# Patient Record
Sex: Male | Born: 1970 | Race: White | Hispanic: No | Marital: Married | Smoking: Former smoker
Health system: Southern US, Community
[De-identification: ages and names within clinical notes are randomized; demographics above are authoritative.]

## PROBLEM LIST (undated history)

## (undated) ENCOUNTER — Ambulatory Visit: Admission: EM | Discharge status: Absent without leave | Payer: Medicare Other

## (undated) DIAGNOSIS — Z113 Encounter for screening for infections with a predominantly sexual mode of transmission: Secondary | ICD-10-CM

## (undated) DIAGNOSIS — I639 Cerebral infarction, unspecified: Secondary | ICD-10-CM

## (undated) DIAGNOSIS — Z79899 Other long term (current) drug therapy: Secondary | ICD-10-CM

## (undated) DIAGNOSIS — T7840XA Allergy, unspecified, initial encounter: Secondary | ICD-10-CM

## (undated) DIAGNOSIS — I10 Essential (primary) hypertension: Secondary | ICD-10-CM

## (undated) DIAGNOSIS — H547 Unspecified visual loss: Secondary | ICD-10-CM

## (undated) DIAGNOSIS — K429 Umbilical hernia without obstruction or gangrene: Secondary | ICD-10-CM

## (undated) DIAGNOSIS — Z789 Other specified health status: Secondary | ICD-10-CM

## (undated) DIAGNOSIS — F419 Anxiety disorder, unspecified: Secondary | ICD-10-CM

## (undated) HISTORY — DX: Essential (primary) hypertension: I10

## (undated) HISTORY — DX: Allergy, unspecified, initial encounter: T78.40XA

## (undated) HISTORY — DX: Other specified health status: Z78.9

## (undated) HISTORY — DX: Unspecified visual loss: H54.7

## (undated) HISTORY — DX: Anxiety disorder, unspecified: F41.9

## (undated) HISTORY — DX: Other long term (current) drug therapy: Z79.899

## (undated) HISTORY — DX: Umbilical hernia without obstruction or gangrene: K42.9

## (undated) HISTORY — DX: Encounter for screening for infections with a predominantly sexual mode of transmission: Z11.3

---

## 2008-04-23 LAB — CONVERTED CEMR LAB
CD4 Count: 152 microliters
CD4 T Helper %: 16 %
HIV 1 RNA Quant: <48 copies/mL

## 2008-08-15 LAB — CONVERTED CEMR LAB
CD4 Count: 256 microliters
CD4 T Helper %: 18 %

## 2009-01-31 ENCOUNTER — Ambulatory Visit: Payer: Self-pay | Admitting: Infectious Diseases

## 2009-01-31 DIAGNOSIS — D696 Thrombocytopenia, unspecified: Secondary | ICD-10-CM | POA: Insufficient documentation

## 2009-01-31 DIAGNOSIS — B59 Pneumocystosis: Secondary | ICD-10-CM

## 2009-01-31 DIAGNOSIS — F1910 Other psychoactive substance abuse, uncomplicated: Secondary | ICD-10-CM

## 2009-01-31 DIAGNOSIS — M545 Low back pain: Secondary | ICD-10-CM

## 2009-01-31 DIAGNOSIS — F341 Dysthymic disorder: Secondary | ICD-10-CM | POA: Insufficient documentation

## 2009-01-31 DIAGNOSIS — B370 Candidal stomatitis: Secondary | ICD-10-CM | POA: Insufficient documentation

## 2009-01-31 DIAGNOSIS — M129 Arthropathy, unspecified: Secondary | ICD-10-CM | POA: Insufficient documentation

## 2009-01-31 DIAGNOSIS — D409 Neoplasm of uncertain behavior of male genital organ, unspecified: Secondary | ICD-10-CM | POA: Insufficient documentation

## 2009-01-31 LAB — CONVERTED CEMR LAB
ALT: 18 units/L (ref 0–53)
Alkaline Phosphatase: 111 units/L (ref 39–117)
Basophils Absolute: 0 10*3/uL (ref 0.0–0.1)
Basophils Relative: 1 % (ref 0–1)
CO2: 24 meq/L (ref 19–32)
Chlamydia, Swab/Urine, PCR: NEGATIVE
Cholesterol: 161 mg/dL (ref 0–200)
Creatinine, Ser: 0.92 mg/dL (ref 0.40–1.50)
Eosinophils Absolute: 0.1 10*3/uL (ref 0.0–0.7)
GC Probe Amp, Urine: NEGATIVE
HIV-1 antibody: POSITIVE — AB
HIV-2 Ab: UNDETERMINED — AB
Hemoglobin, Urine: NEGATIVE
Hep B Core Total Ab: NEGATIVE
Hep B S Ab: NEGATIVE
Ketones, ur: NEGATIVE mg/dL
LDL Cholesterol: 110 mg/dL — ABNORMAL HIGH (ref 0–99)
Leukocytes, UA: NEGATIVE
MCHC: 34.8 g/dL (ref 30.0–36.0)
MCV: 96.2 fL (ref >=78.0)
Neutro Abs: 3.1 10*3/uL (ref 1.7–7.7)
Neutrophils Relative %: 59 % (ref 43–77)
Nitrite: NEGATIVE
Platelets: 161 10*3/uL (ref 150–400)
RBC: 4.48 M/uL (ref 4.22–5.81)
RDW: 12.7 % (ref 11.5–15.5)
Sodium: 138 meq/L (ref 135–145)
Total Bilirubin: 3.2 mg/dL — ABNORMAL HIGH (ref 0.3–1.2)
Total CHOL/HDL Ratio: 4.6
Total Protein: 6.8 g/dL (ref 6.0–8.3)
Triglycerides: 80 mg/dL (ref <150)
Urobilinogen, UA: 0.2 (ref 0.0–1.0)
VLDL: 16 mg/dL (ref 0–40)

## 2009-02-01 ENCOUNTER — Encounter: Payer: Self-pay | Admitting: Infectious Diseases

## 2009-02-01 LAB — CONVERTED CEMR LAB: HIV 1 RNA Quant: 120 copies/mL — ABNORMAL HIGH (ref <48)

## 2009-02-14 ENCOUNTER — Ambulatory Visit: Payer: Self-pay | Admitting: Infectious Diseases

## 2009-02-14 DIAGNOSIS — S52209A Unspecified fracture of shaft of unspecified ulna, initial encounter for closed fracture: Secondary | ICD-10-CM | POA: Insufficient documentation

## 2009-02-14 DIAGNOSIS — B20 Human immunodeficiency virus [HIV] disease: Secondary | ICD-10-CM

## 2009-04-03 ENCOUNTER — Encounter: Payer: Self-pay | Admitting: Infectious Diseases

## 2010-02-18 ENCOUNTER — Ambulatory Visit
Admission: RE | Admit: 2010-02-18 | Discharge: 2010-02-18 | Payer: Self-pay | Source: Home / Self Care | Attending: Adult Health | Admitting: Adult Health

## 2010-02-18 ENCOUNTER — Telehealth: Payer: Self-pay | Admitting: Adult Health

## 2010-02-18 ENCOUNTER — Encounter: Payer: Self-pay | Admitting: Adult Health

## 2010-02-18 DIAGNOSIS — N400 Enlarged prostate without lower urinary tract symptoms: Secondary | ICD-10-CM | POA: Insufficient documentation

## 2010-02-18 DIAGNOSIS — K5900 Constipation, unspecified: Secondary | ICD-10-CM | POA: Insufficient documentation

## 2010-02-18 LAB — CONVERTED CEMR LAB
ALT: 26 units/L (ref 0–53)
AST: 22 units/L (ref 0–37)
Alkaline Phosphatase: 101 units/L (ref 39–117)
BUN: 11 mg/dL (ref 6–23)
Basophils Absolute: 0 10*3/uL (ref 0.0–0.1)
Basophils Relative: 0 % (ref 0–1)
Blood, UA: NEGATIVE
Creatinine, Ser: 0.93 mg/dL (ref 0.40–1.50)
Eosinophils Absolute: 0.2 10*3/uL (ref 0.0–0.7)
Eosinophils Relative: 2 % (ref 0–5)
GC Probe Amp, Urine: NEGATIVE
HCT: 44.9 % (ref 39.0–52.0)
HIV 1 RNA Quant: <20 copies/mL (ref <20)
HIV-1 RNA Quant, Log: <1.3 (ref <1.30)
Hemoglobin: 15 g/dL (ref 13.0–17.0)
Ketones, ur: NEGATIVE mg/dL
Lymphocytes Relative: 19 % (ref 12–46)
MCHC: 33.4 g/dL (ref 30.0–36.0)
MCV: 101.1 fL — ABNORMAL HIGH (ref 78.0–100.0)
Monocytes Absolute: 0.5 10*3/uL (ref 0.1–1.0)
Nitrite: NEGATIVE
RDW: 12.2 % (ref 11.5–15.5)
Total Bilirubin: 1.7 mg/dL — ABNORMAL HIGH (ref 0.3–1.2)
Total CHOL/HDL Ratio: 4.6
pH: 6 (ref 5.0–8.0)

## 2010-02-19 LAB — T-HELPER CELL (CD4) - (RCID CLINIC ONLY): CD4 % Helper T Cell: 23 % — ABNORMAL LOW (ref 33–55)

## 2010-02-20 ENCOUNTER — Telehealth (INDEPENDENT_AMBULATORY_CARE_PROVIDER_SITE_OTHER): Payer: Self-pay | Admitting: *Deleted

## 2010-02-24 ENCOUNTER — Telehealth (INDEPENDENT_AMBULATORY_CARE_PROVIDER_SITE_OTHER): Payer: Self-pay | Admitting: *Deleted

## 2010-02-25 ENCOUNTER — Telehealth: Payer: Self-pay | Admitting: Adult Health

## 2010-02-25 ENCOUNTER — Ambulatory Visit: Admit: 2010-02-25 | Payer: Self-pay | Admitting: Adult Health

## 2010-02-25 NOTE — Assessment & Plan Note (Signed)
Summary: NEW 042 INTAKE 1/6   Visit Type:  New Patient Primary Provider:  Clydie Braun MD  CC:  new patient.  History of Present Illness: 40 yo wm with HIV, stable on meds, here to establish care after moving from [facility].  Last seen there in July 2010.  No new complaints or issues. He did break his L forearm 5 wks ago roller skating. Otherwise 100% compliacne and no side effects. Is on disability.   Has been on reyataz, norvir and truvada for over a year and tolerating it well.  Preventive Screening-Counseling & Management  Alcohol-Tobacco     Alcohol drinks/day: 0     Smoking Status: current     Smoking Cessation Counseling: yes     Packs/Day: 0.5  Caffeine-Diet-Exercise     Caffeine use/day: coffee     Does Patient Exercise: yes     Type of exercise: gym membership     Exercise (avg: min/session): 30-60     Times/week: 3  Safety-Violence-Falls     Seat Belt Use: yes   Prior Medication List:  No prior medications documented  Updated Prior Medication List: TRUVADA 200-300 MG TABS (EMTRICITABINE-TENOFOVIR) take one tablet once daily REYATAZ 300 MG CAPS (ATAZANAVIR SULFATE) take one capsule daily NORVIR 100 MG TABS (RITONAVIR) take one daily ALPRAZOLAM 0.25 MG TABS (ALPRAZOLAM) dose unknown, prescirbed by primary care physician VICODIN 5-500 MG TABS (HYDROCODONE-ACETAMINOPHEN) dose unknown, prescribed by primary care physician  Current Allergies: No known allergies  Past History:  Social History: Last updated: 02/14/2009 disabled. USed to work in [employer]. Courrent tobacco but quit once before and plans to quit again. Lives with his second wife and 2 kids.  He has custody of them from his ex wife  Risk Factors: Alcohol Use: 0 (02/14/2009) Caffeine Use: coffee (02/14/2009) Exercise: yes (02/14/2009)  Risk Factors: Smoking Status: current (02/14/2009) Packs/Day: 0.5 (02/14/2009)  Past Medical History: RA - follows with rheum in  [place]  HIV dxed 2006 when had pcp pna - hospitalized for 2 months PCP PNA Thrush - resolved  Fx of l arm Dec 10  Family History: [place]  Social History: disabled. USed to work in [employer]. Courrent tobacco but quit once before and plans to quit again. Lives with his second wife and 2 kids.  He has custody of them from his ex wife  Review of Systems       11 systems reviewed and negative except per HPI   Vital Signs:  Patient profile:   40 year old male Height:      70 inches (177.80 cm) Weight:      202.0 pounds (91.82 kg) BMI:     29.09 Temp:     97.0 degrees F (36.11 degrees C) oral Pulse rate:   72 / minute BP sitting:   113 / 70  (right arm)  Vitals Entered By: Baxter Hire) (February 14, 2009 10:51 AM) CC: new patient Is Patient Diabetic? No Pain Assessment Patient in pain? no      Nutritional Status BMI of 25 - 29 = overweight Nutritional Status Detail appetite is good per patient  Does patient need assistance? Functional Status Self care Ambulation Normal   Physical Exam  General:  alert and well-developed.   Head:  normocephalic.   Eyes:  vision grossly intact, pupils equal, and pupils round.   Ears:  R ear normal and L ear normal.   Nose:  no external deformity.   Mouth:  good dentition and no gingival  abnormalities.   Neck:  supple.   Lungs:  normal respiratory effort, no intercostal retractions, and normal breath sounds.   Heart:  normal rate and regular rhythm.   Abdomen:  soft, non-tender, and normal bowel sounds.   Msk:  L arm in cast Extremities:  no cce Skin:  no rashes.   Cervical Nodes:  no anterior cervical adenopathy and no posterior cervical adenopathy.   Psych:  somewhat flat affect        Medication Adherence: 02/14/2009   Adherence to medications reviewed with patient. Counseling to provide adequate adherence provided   Prevention For Positives: 02/14/2009   Safe sex practices discussed with patient. Condoms  offered.                             Impression & Recommendations:  Problem # 1:  HIV INFECTION (ICD-042)  40 yo with HIV since 2006.  Doing great on current regimen.  - cont norvir, reyataz and truvada. Need to obtain vaccine history from [facility] clinic for documataion   Diagnostics Reviewed:  HIV: REACTIVE (01/31/2009)   HIV-Western blot: Positive (01/31/2009)   CD4: 360 (02/01/2009)   WBC: 5.2 (01/31/2009)   Hgb: 15.0 (01/31/2009)   HCT: 43.1 (01/31/2009)   Platelets: 161 (01/31/2009) HIV-1 RNA: 120 (02/01/2009)   HBSAg: NEG (01/31/2009)  Orders: New Patient Level IV (99204)Future Orders: T-CD4SP ([facility]) (CD4SP) ... 08/13/2009 T-HIV Viral Load [id number]) ... 08/13/2009 T-CBC w/Diff ([id number]) ... 08/13/2009 T-Comprehensive Metabolic Panel [id number]) ... 08/13/2009  Problem # 2:  CLOSED FRACTURE OF UNSPECIFIED PART OF ULNA (ICD-813.82) follow up with pcp  Problem # 3:  Hx of LOW BACK PAIN, CHRONIC (ICD-724.2) follows with pcp in [place] for his refills on pain meds His updated medication list for this problem includes:    Vicodin 5-500 Mg Tabs (Hydrocodone-acetaminophen) .Marland Kitchen... Dose unknown, prescribed by primary care physician  Patient Instructions: 1)  Please schedule a follow-up appointment in 6 months. 2)  Be sure to return for lab work one (1) week before your next appointment as scheduled.  Prescriptions: NORVIR 100 MG TABS (RITONAVIR) take one daily  #31 x 6   Entered and Authorized by:   Clydie Braun MD   Signed by:   Clydie Braun MD on 02/14/2009   Method used:   Print then Give to Patient   RxID:   [id number] REYATAZ 300 MG CAPS (ATAZANAVIR SULFATE) take one capsule daily  #31 x 6   Entered and Authorized by:   Clydie Braun MD   Signed by:   Clydie Braun MD on 02/14/2009   Method used:   Print then Give to Patient   RxID:   [id number] TRUVADA 200-300 MG TABS (EMTRICITABINE-TENOFOVIR) take one tablet once  daily  #31 x 6   Entered and Authorized by:   Clydie Braun MD   Signed by:   Clydie Braun MD on 02/14/2009   Method used:   Print then Give to Patient   RxID:   [id number]  Process Orders Check Orders Results:     [facility]: Check successful Tests Sent for requisitioning (February 14, 2009 11:19 AM):     08/13/2009: [facility] -- T-HIV Viral Load [id number] (signed)     08/13/2009: [facility] -- T-CBC w/Diff [[id number]] (signed)     08/13/2009: [facility] -- T-Comprehensive Metabolic Panel [id number] (signed)

## 2010-02-25 NOTE — Assessment & Plan Note (Signed)
Summary: NEW 042 INTAKE    Infectious Disease New Patient Intake Referring MD/Agency: [facility] Address: [address] [facility] [address] [facility] [address] ,  Outpatient [place], [place] [address]  Medical Records: Received Health Insurance / Payor: More than 1 Employer: Disabled    Does insurance cover prescriptions? Yes Our patient has been informed that medication assistance programs are available.  Our Co-ordinator will be meeting with the patient during this visit to discuss financial and medication assistance.   Do you have a Primary physician: Yes Physician Name: Pasty Arch    City/State: [place], [place]  Are family members aware of patient's diagnosis?  If so, are they supportive? 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  Behavioral Health Assessment Have you ever been diagnosed with depression or mental illness? Yes  Diagnosis: Previous history of depression upon diagnosis Do you drink alcohol? Yes Alcohol Beverage Type(s): beer , occasional 6 pack  Do you use recreational drugs? No Do you feel you have a problem with drugs and/or alcohol? No   Have you ever been in a treatment facility for any addiction? No  HIV Intake Information When did you first test positive for HIV? 2006 Type of test Conducted: WB    HIV Medications Information  Nucleoside Reverse Transcriptase Inhibitors (NRTI's) Combivir (Lamivadin 150mg /Zidovudine 300mg ): Yes   Last Date of Use:  per patient failed   Non-Nucleoside Reverse Transcriptase Inhibitors (NNRTI's) Sustiva (Efavirenz): Yes   Last Date of Use:  per pt failed    Infection History  Patient has been diagnosed with the following opportunistic infections: Are there any other symptoms you need to discuss? No Have you received literature/education prior to this visit about HIV/AIDS? No Do you understand the meaning of a Viral Load? Yes Do you understand the meaning of a CD4 count? Yes Lab Values Education/Handout Given Yes Medication Education/Handout Given Yes  Sexual History Are you in a current relationship? Yes How long have you been in this relationship? 7 years , married Are they aware of your diagnosis? Yes Have they been tested for HIV? Yes What were the results: Negative Are you currently sexually active? Yes Was this protected intercourse? Yes Safe Sex Counseling/Pamphlet Given  Evaluation and Follow-Up INTAKE CHECK LIST: HIV Education, Safe Sex Counseling  Prevention For Positives: 02/07/2009   Safe sex practices discussed with patient. Condoms offered. Does patient have problems that warrant Social Worker referral? No  Will contact [facility] for records that are specific to HIV treatment. Records received are realated to ED visitis.   Immunization History:  Influenza Immunization History:    Influenza:  historical (10/26/2008)  Pneumovax Immunization History:    Pneumovax:  historical (10/27/2007)  Immunizations Administered:  PPD Skin Test:    Vaccine Type: PPD    Site: right forearm    Mfr: Sanofi Pasteur    Dose: 0.1 ml    Route: ID    Given by: Tomasita Morrow RN    Exp. Date: 06/23/2011    Lot #: [id number]  PPD Results    Date of reading: 02/09/2009    Results: < 5mm    Interpretation: negative    -  Date:  08/15/2008    Viral Load: <48    Glucose: 150    CD4: 256    CD4%: 18  Date:  04/23/2008    Viral Load: <48    CD4: 152    CD4%: 16

## 2010-02-25 NOTE — Consult Note (Signed)
Summary: [facility]   Imported By: Florinda Marker 04/03/2009 09:38:05  _____________________________________________________________________  External Attachment:    Type:   Image     Comment:   External Document

## 2010-02-25 NOTE — Miscellaneous (Signed)
Summary: HIPAA Restrictions  HIPAA Restrictions   Imported By: Florinda Marker 01/31/2009 15:35:16  _____________________________________________________________________  External Attachment:    Type:   Image     Comment:   External Document

## 2010-02-25 NOTE — Consult Note (Signed)
Summary: New Pt Referral:  New Pt Referral:   Imported By: Florinda Marker 02/28/2009 15:44:42  _____________________________________________________________________  External Attachment:    Type:   Image     Comment:   External Document

## 2010-02-27 ENCOUNTER — Telehealth (INDEPENDENT_AMBULATORY_CARE_PROVIDER_SITE_OTHER): Payer: Self-pay | Admitting: *Deleted

## 2010-02-27 NOTE — Progress Notes (Signed)
  Phone Note Outgoing Call   Call placed by: Alesia Morin CMA,  February 20, 2010 3:54 PM Call placed to: Patient Details for Reason: Called pt about UT Summary of Call: Left message for patient of call me back at earliest convenience

## 2010-02-27 NOTE — Progress Notes (Signed)
Summary: Decreased energy   Call back at walk in    Summary of Call: Pt  walked into clinic today. He states he has been trying to find our clinic.   He was a pt of Dr Sampson Goon . he will need OV and labs.  Pt is demanding OV today. He states he is having problems and he has called several times and no one has responded to his calls. Appt given as work in with NP Sundra Aland. Pt was advised he will be worked into schedule and this may take some time. Pt was very rude and loud in the waiting area while blurting out" he has HIV.  Tomasita Morrow RN  February 18, 2010 10:41 AM  Initial call taken by: Tomasita Morrow RN,  February 18, 2010 9:32 AM

## 2010-03-03 ENCOUNTER — Encounter: Payer: Self-pay | Admitting: Adult Health

## 2010-03-04 ENCOUNTER — Encounter: Payer: Self-pay | Admitting: Adult Health

## 2010-03-04 ENCOUNTER — Telehealth (INDEPENDENT_AMBULATORY_CARE_PROVIDER_SITE_OTHER): Payer: Self-pay | Admitting: *Deleted

## 2010-03-05 ENCOUNTER — Ambulatory Visit: Payer: Self-pay | Admitting: Adult Health

## 2010-03-05 NOTE — Progress Notes (Addendum)
Summary: Wife calling with concerns  Phone Note Call from Patient   Caller: wife Darrell Schwartz Summary of Call: Pt's wife called c/o " we are not treating her husband with dignity or respect due to [name]" He is in pain and is not able to urinate.  She stated he was told we would schedule his procedure the next day after his OV and did not get a return call.   She is very upset and does not think we have given him the proper care he deserves.    Follow-up for Phone Call        I spoke with Patient's wife. Darrell Schwartz has been in contact with two nurses from this office since his last OV.  He was advised that Radiology was not able to perform the testicular ultrasound he needed and we would need to make a urology referral.  He was scheduled for a return visit today and Traci Sermon our NP was going to address the referral.   His wife stated he would not be coming for his appt.  today and would not be coming back to our clinic. He would be going back to [facility] where he was treated with respect and dignity. She then hung up the phone.    Tomasita Morrow RN  February 25, 2010 3:35 PM

## 2010-03-05 NOTE — Progress Notes (Signed)
Summary: Pt. calling about appts for ultrasound and xrays.  Phone Note Call from Patient Call back at [facility] Phone [phone] Call back at [phone] - house number   Caller: Patient Reason for Call: Talk to Nurse Summary of Call: Pt. waiting to be called regarding appt. for U/S and xrays.  Please call [phone] and leave a message re: these appoinments.  Jennet Maduro RN  February 24, 2010 1:40 PM   Follow-up for Phone Call        Pt called on 02/19/2010 and advised that UT of Prostate will be scheduled after next OV if necessary. Pt was given instructions to continue antibiotics and notify clinic if symptoms become worse or any new symptoms arise. Pt stated at this time things are a little better. He was told that the situation will be reassesed at next visit 02/25/10 with the doctor and he may need to be referred to a Urologist at that time. The local imaging stations do not do Prostate UT's. Pt said he understood. Follow-up by: Alesia Morin CMA,  February 25, 2010 11:27 AM

## 2010-03-05 NOTE — Progress Notes (Signed)
Summary: patient wants work excuse  Phone Note Call from Patient   Summary of Call: Patient was given a note to be out of work last week, and he was out of work this week as, he wants to know if he can have a work note Initial call taken by: Starleen Arms CMA,  February 27, 2010 11:44 AM  Follow-up for Phone Call        Phone Call Completed. Pt was informed that due to information given by the family that patient was transfering care to other facility and missed follow up appointment 02/25/2010 we will not be able to give a note for missed work 1/30-2/03/2010. Patient advised he was supposed to return to clinic at a later date after Prostate UT,unaware of 1/31 appointment and wife only said he was not comming back out of frustration. Reminded patient of conversation from 1/26 about taking the medication and calling back if symptoms got worse and being referred out to Urologist after follow-up visit because no radiology facilities do prostate UT's. He was said he did not remember the phone call and it never happened. Told him he spoke with me and agreed with the plan and he became angry and said he just needs a note. Unable to ask patient what he wanted to do at this point because he was angry and hung up the phone as i was talking. Follow-up by: Alesia Morin CMA,  February 28, 2010 11:35 AM     Appended Document: patient wants work excuse Given the current history regarding this patient's demeanor and the recent encounter with wife stating they were not returning to clinic, no further written work excuses should be written by this provider.  MA and lead RN notified of this decision and concurred with this plan.

## 2010-03-06 ENCOUNTER — Ambulatory Visit: Payer: Self-pay | Admitting: Adult Health

## 2010-03-07 ENCOUNTER — Encounter: Payer: Self-pay | Admitting: Adult Health

## 2010-03-13 NOTE — Progress Notes (Signed)
  Phone Note Outgoing Call   Call placed by: Alesia Morin CMA,  March 04, 2010 4:43 PM Call placed to: Specialist Summary of Call: [facility] to verify that patient came for appt scheduled today and he did they will send note as soon as they are available Alesia Morin [facility]  March 04, 2010 4:48 PM

## 2010-03-14 ENCOUNTER — Ambulatory Visit (HOSPITAL_BASED_OUTPATIENT_CLINIC_OR_DEPARTMENT_OTHER)
Admission: RE | Admit: 2010-03-14 | Discharge: 2010-03-14 | Disposition: A | Discharge status: Home / Self Care | Payer: Medicare Other | Source: Ambulatory Visit | Attending: Urology | Admitting: Urology

## 2010-03-14 DIAGNOSIS — N509 Disorder of male genital organs, unspecified: Secondary | ICD-10-CM | POA: Insufficient documentation

## 2010-03-14 DIAGNOSIS — Z01812 Encounter for preprocedural laboratory examination: Secondary | ICD-10-CM | POA: Insufficient documentation

## 2010-03-14 DIAGNOSIS — R350 Frequency of micturition: Secondary | ICD-10-CM | POA: Insufficient documentation

## 2010-03-14 DIAGNOSIS — R3911 Hesitancy of micturition: Secondary | ICD-10-CM | POA: Insufficient documentation

## 2010-03-14 LAB — POCT I-STAT 4, (NA,K, GLUC, HGB,HCT)
Glucose, Bld: 103 mg/dL — ABNORMAL HIGH (ref 70–99)
HCT: 45 % (ref 39.0–52.0)

## 2010-03-18 NOTE — Op Note (Signed)
  NAME:  Darrell Schwartz, Darrell Schwartz NO.:  [id number]  MEDICAL RECORD NO.:  [id number]           PATIENT TYPE:  LOCATION:                                 FACILITY:  PHYSICIAN:  Danae Chen, M.D.       DATE OF BIRTH:  DATE OF PROCEDURE:  03/14/2010 DATE OF DISCHARGE:                              OPERATIVE REPORT   PREOPERATIVE DIAGNOSIS:  Voiding dysfunction, rule out urethral stricture.  POSTOPERATIVE DIAGNOSIS:  No urethral stricture.  PROCEDURE DONE:  Cystoscopy.  ANESTHESIA:  General.  INDICATIONS:  The patient is a 40 year old male who has been complaining of frequency, hesitancy, decreased stream, straining on urination, and testicular pain.  Scrotal ultrasound showed normal testes.  He was started on Rapaflo and his voiding improved the first-time he took the Rapaflo; however after that, his symptoms recurred.  He has been having retrograde ejaculations.  A week ago after intercourse, he had severe testicular pain.  On examination, both testicles are felt normal. Repeat scrotal ultrasound showed increased flow to the tail of both epididymides.  However, there was no testicular mass, no evidence of torsion, and there was no swelling of the epididymis on examination. The patient was advised to have a cystoscopy.  He preferred to have it done under general anesthesia.  He is scheduled today for the procedure.  DESCRIPTION OF PROCEDURE:  The patient was identified by his wrist band, and proper time-out was taken.  Under general anesthesia, the patient was prepped and draped and placed in the dorsal lithotomy position.  A panendoscope was inserted in the bladder.  The urethra is normal.  There is no evidence of urethral stricture.  The prostatic urethra is normal.  The bladder mucosa is normal.  There is no stone or tumor in the bladder.  The ureteral orifices are in normal position and shape with clear efflux.  The bladder was examined with both the Foroblique and  the right angle lenses.  The bladder was then emptied and the cystoscope removed.  The 1 ampule of 2% Xylocaine jelly was then instilled in the urethra.  The patient tolerated the procedure well and left the OR in satisfactory condition to post anesthesia care unit.     Danae Chen, M.D.     MN/MEDQ  D:  03/14/2010  T:  03/14/2010  Job:  [id number]  cc:   [facility]  Electronically Signed by Lindaann Slough M.D. on 03/18/2010 11:52:41 AM

## 2010-03-25 NOTE — Letter (Signed)
Summary: Work Note  Work Note   Imported By: Florinda Marker 03/21/2010 15:28:57  _____________________________________________________________________  External Attachment:    Type:   Image     Comment:   External Document

## 2010-04-04 ENCOUNTER — Ambulatory Visit (INDEPENDENT_AMBULATORY_CARE_PROVIDER_SITE_OTHER): Payer: Medicare Other | Admitting: Adult Health

## 2010-04-04 DIAGNOSIS — F341 Dysthymic disorder: Secondary | ICD-10-CM

## 2010-04-04 DIAGNOSIS — B20 Human immunodeficiency virus [HIV] disease: Secondary | ICD-10-CM

## 2010-04-04 DIAGNOSIS — J069 Acute upper respiratory infection, unspecified: Secondary | ICD-10-CM

## 2010-04-04 DIAGNOSIS — N400 Enlarged prostate without lower urinary tract symptoms: Secondary | ICD-10-CM

## 2010-04-13 LAB — T-HELPER CELL (CD4) - (RCID CLINIC ONLY)
CD4 % Helper T Cell: 25 % — ABNORMAL LOW (ref 33–55)
CD4 T Cell Abs: 360 uL — ABNORMAL LOW (ref 400–2700)

## 2010-04-15 NOTE — Consult Note (Signed)
Summary: [facility]   Imported By: Florinda Marker 04/09/2010 15:23:35  _____________________________________________________________________  External Attachment:    Type:   Image     Comment:   External Document

## 2010-04-15 NOTE — Consult Note (Signed)
Summary: [facility]  [facility]   Imported By: Florinda Marker 04/09/2010 15:20:41  _____________________________________________________________________  External Attachment:    Type:   Image     Comment:   External Document

## 2010-05-16 ENCOUNTER — Other Ambulatory Visit: Payer: Self-pay | Admitting: Licensed Clinical Social Worker

## 2010-05-16 ENCOUNTER — Other Ambulatory Visit: Payer: Self-pay | Admitting: Infectious Diseases

## 2010-05-16 ENCOUNTER — Other Ambulatory Visit (INDEPENDENT_AMBULATORY_CARE_PROVIDER_SITE_OTHER): Payer: Medicare Other

## 2010-05-16 DIAGNOSIS — B20 Human immunodeficiency virus [HIV] disease: Secondary | ICD-10-CM

## 2010-05-16 LAB — T-HELPER CELL (CD4) - (RCID CLINIC ONLY): CD4 % Helper T Cell: 22 % — ABNORMAL LOW (ref 33–55)

## 2010-05-16 LAB — CBC WITH DIFFERENTIAL/PLATELET
Eosinophils Absolute: 0.2 10*3/uL (ref 0.0–0.7)
Eosinophils Relative: 4 % (ref 0–5)
Hemoglobin: 15.3 g/dL (ref 13.0–17.0)
Lymphs Abs: 1.3 10*3/uL (ref 0.7–4.0)
MCH: 33.6 pg (ref 26.0–34.0)
MCV: 95.8 fL (ref 78.0–100.0)
Monocytes Relative: 7 % (ref 3–12)
RBC: 4.55 MIL/uL (ref 4.22–5.81)

## 2010-05-17 LAB — COMPLETE METABOLIC PANEL WITH GFR
ALT: 25 U/L (ref 0–53)
AST: 25 U/L (ref 0–37)
Creat: 0.88 mg/dL (ref 0.40–1.50)
Total Bilirubin: 2.5 mg/dL — ABNORMAL HIGH (ref 0.3–1.2)

## 2010-05-19 LAB — HIV-1 RNA QUANT-NO REFLEX-BLD: HIV 1 RNA Quant: <20 copies/mL (ref <20)

## 2010-05-28 DIAGNOSIS — J069 Acute upper respiratory infection, unspecified: Secondary | ICD-10-CM | POA: Insufficient documentation

## 2010-05-28 NOTE — Progress Notes (Signed)
Vital Signs:  Patient profile: 40 year old male Height:    70 inches Weight:    197 pounds BMI:  28.37 Temp:  97.9 degrees F oral Pulse rate: 67 / minute BP sitting: 127 / 87  (right arm)  Vitals Entered By: Alesia Morin CMA (April 04, 2010 9:40 AM) CC: follow-up visit to sick visit pt saw the urologist and has been sick with the noro-virus stopped xanax cold [place] and need a refill if provider will perscribe. Nutritional Status BMI of 25 - 29 = overweight Nutritional Status Detail appeitie "good"  Have you ever been in a relationship where you felt threatened, hurt or afraid?No   Does patient need assistance?  Functional Status Self care Ambulation Normal Comments no missed meds   Primary Provider:  Clydie Braun MD  CC:  follow-up visit to sick visit pt saw the urologist and has been sick with the noro-virus stopped xanax cold [place] and need a refill if provider will perscribe.Marland Kitchen  History of Present Illness:  presents to clinic for followup. Has been seen by urology , who stated he had some stricture which was treated in his urination problems, have since ceased. However , recently he was diagnosed with a normal. Rotavirus has had cold like symptoms from what she is currently recovering. Prior to him having these symptoms. His malaise and fatigue has actually improved considerably. Additionally , he has run out of his alprazolam and has stopped it  , " cold [place]." this is caused an increase in his " nervousness" and anxiety. Requesting refill for this medication as well.  Preventive Screening-Counseling & Management  Alcohol-Tobacco     Alcohol drinks/day: 0     Smoking Status: current     Smoking Cessation Counseling: yes     Packs/Day: 0.5  Caffeine-Diet-Exercise     Caffeine use/day: coffee     Does Patient Exercise: yes     Type of exercise: gym membership     Exercise (avg: min/session): 30-60     Times/week: 3  Safety-Violence-Falls     Seat Belt Use:  yes      Sexual History:  currently monogamous.        Drug Use:  No.        Blood Transfusions:  no.        Travel History:  no.    Comments:  pt declined condoms  Allergies (verified):  No Known Drug Allergies  Social History: Sexual History:  currently monogamous Blood Transfusions:  no Travel History:  no  Review of Systems        The patient complains of fever, muscle weakness, and depression.  The patient denies anorexia, weight loss, weight gain, vision loss, decreased hearing, hoarseness, chest pain, syncope, dyspnea on exertion, peripheral edema, prolonged cough, headaches, hemoptysis, abdominal pain, melena, hematochezia, severe indigestion/heartburn, hematuria, incontinence, genital sores, suspicious skin lesions, difficulty walking, unusual weight change, abnormal bleeding, enlarged lymph nodes, angioedema, and testicular masses.    Physical Exam  General:  alert, well-developed, well-nourished, and well-hydrated.   Head:  normocephalic, atraumatic, no abnormalities observed, and no abnormalities palpated.   Eyes:  vision grossly intact, pupils equal, pupils round, and pupils reactive to light.   Ears:  R ear normal and L ear normal.   Nose:  no external deformity, external erythema, and nasal dischargemucosal pallor.   Mouth:  pharynx pink and moist and fair dentition.   Neck:  supple, full ROM, and no masses.   Lungs:  normal  respiratory effort, no intercostal retractions, and normal breath sounds.   Heart:  normal rate and regular rhythm.   Abdomen:  soft, non-tender, and normal bowel sounds.   Msk:  normal ROM.   Neurologic:  alert & oriented X3, cranial nerves II-XII intact, strength normal in all extremities, and gait normal.   Skin:  color normal, no rashes, and no suspicious lesions.   Psych:  Oriented X3, memory intact for recent and remote, normally interactive, good eye contact, not anxious appearing, and not depressed appearing.     Impression &  Recommendations:  Problem # 1:  HIV INFECTION (ICD-042)  from labs obtained in January 2012, his CD4 count was 333 at 23% with a viral load of less than 20 copies per mL. He is clinically stable on his Reyataz ,  Norvir,  and Truvada. Recommend continuing present management , repeat labs in 10 weeks , and following up in clinic in 3 months. Verbally acknowledged this and agreed with plan. His updated medication list for this problem includes:    Ciprofloxacin Hcl 500 Mg Tabs (Ciprofloxacin hcl) .Marland Kitchen... 1 tablet by mouth every 12 hours.  Problem # 2:  Hx of ANXIETY DEPRESSION (ICD-300.4)  we discussed in detail  the need for him to have a primary care provider closer to his home. Given many of his other health issues a provider closer to his residence. Would be beneficial for his overall health as [name] is relatively stable. We agreed to give him a limited supply of alprazolam while he searches for a primary care provider near his home. He verbally acknowledged this information and agreed with our plan.  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 We recommend that he continue his followup with urology and that he contact them should he have any further urinary problems or symptoms. He verbally acknowledged this and agreed with plan.  Problem # 4:  VIRAL URI (ICD-465.9)  although it is not documented, where he received the diagnosis of "novo-virus" it is apparent that he is convalescing well as he remains afebrile and overall appears physically well. Would recommend followup with a primary care provider. Should any further problems develop. Verbally acknowledged this and agreed with plan.  Medications Added to Medication List This Visit: 1)  Alprazolam 1 Mg Tabs (Alprazolam) .Marland Kitchen.. 1 tab by mouth three times a day as  needed Prescriptions: ALPRAZOLAM 1 MG TABS (ALPRAZOLAM) 1 tab by mouth three times a day as needed  #45 x 0  Entered and Authorized by: Talmadge Chad NP  Signed by: Talmadge Chad NP on 04/04/2010  Method used: Print then Give to Patient  RxID: [id number]          Signed by Talmadge Chad NP on 05/28/2010 at 2:37 PM  ________________________________________________________________________

## 2010-05-30 ENCOUNTER — Ambulatory Visit: Payer: Medicare Other | Admitting: Adult Health

## 2010-06-09 ENCOUNTER — Ambulatory Visit (INDEPENDENT_AMBULATORY_CARE_PROVIDER_SITE_OTHER): Payer: Medicare Other | Admitting: Adult Health

## 2010-06-09 ENCOUNTER — Ambulatory Visit
Admission: RE | Admit: 2010-06-09 | Discharge: 2010-06-09 | Disposition: A | Discharge status: Home / Self Care | Payer: Medicare Other | Source: Ambulatory Visit | Attending: Adult Health | Admitting: Adult Health

## 2010-06-09 ENCOUNTER — Other Ambulatory Visit: Payer: Self-pay | Admitting: Adult Health

## 2010-06-09 ENCOUNTER — Encounter: Payer: Self-pay | Admitting: Adult Health

## 2010-06-09 DIAGNOSIS — S4992XA Unspecified injury of left shoulder and upper arm, initial encounter: Secondary | ICD-10-CM

## 2010-06-09 DIAGNOSIS — F17200 Nicotine dependence, unspecified, uncomplicated: Secondary | ICD-10-CM

## 2010-06-09 DIAGNOSIS — Z720 Tobacco use: Secondary | ICD-10-CM | POA: Insufficient documentation

## 2010-06-09 DIAGNOSIS — S4980XA Other specified injuries of shoulder and upper arm, unspecified arm, initial encounter: Secondary | ICD-10-CM

## 2010-06-09 DIAGNOSIS — Z79899 Other long term (current) drug therapy: Secondary | ICD-10-CM

## 2010-06-09 DIAGNOSIS — B20 Human immunodeficiency virus [HIV] disease: Secondary | ICD-10-CM

## 2010-06-09 MED ORDER — NICOTINE 21 MG/24HR TD PT24: 1.0000 | MEDICATED_PATCH | TRANSDERMAL | Status: AC

## 2010-06-09 NOTE — Progress Notes (Signed)
  Subjective:    Patient ID: Darrell Schwartz, male    DOB: 1971-01-26, 40 y.o.   MRN: [id number]  HPI Presents today for followup. States approximately one to 2 weeks ago he injured his left shoulder playing baseball. Has been having problems with external and internal rotation of left shoulder, but is able to hyperextend the left shoulder without pain. Symptoms have not worsened, but have not improved since injury. He remains adherent to his antiretrovirals with good tolerance. Denies any further problems with urination. Requesting nicotine patch for smoking cessation.   Review of Systems  Constitutional: Positive for activity change.  HENT: Negative.   Eyes: Negative.   Respiratory: Negative.   Cardiovascular: Negative.   Gastrointestinal: Negative.   Genitourinary: Negative.   Musculoskeletal: Positive for myalgias and arthralgias. Negative for back pain, joint swelling and gait problem.  Skin: Negative.   Neurological: Negative.   Hematological: Negative.   Psychiatric/Behavioral: Negative.        Objective:   Physical Exam  Constitutional: He is oriented to person, place, and time. He appears well-developed and well-nourished. No distress.  HENT:  Head: Normocephalic and atraumatic.  Right Ear: External ear normal.  Left Ear: External ear normal.  Nose: Nose normal.  Mouth/Throat: Oropharynx is clear and moist.  Eyes: Conjunctivae and EOM are normal. Pupils are equal, round, and reactive to light.  Neck: Normal range of motion. Neck supple. No thyromegaly present.  Cardiovascular: Normal rate, regular rhythm, normal heart sounds and intact distal pulses.   Pulmonary/Chest: Effort normal and breath sounds normal.  Abdominal: Soft. Bowel sounds are normal.  Musculoskeletal: He exhibits tenderness.       Limited internal and external rotation noted to left shoulder with some point tenderness around the rotator cuff. Able to extend and hyperextend, left upper extremity.    Lymphadenopathy:    He has no cervical adenopathy.  Neurological: He is alert and oriented to person, place, and time. No cranial nerve deficit. He exhibits normal muscle tone. Coordination normal.  Skin: Skin is warm and dry.  Psychiatric: He has a normal mood and affect. His behavior is normal. Judgment and thought content normal.          Assessment & Plan:  1. HIV. CD4 count is 290 at 23% with a viral load of less than 20 copies per mL. Clinically stable on current regimen. Recommend continuing present management with a followup in 4 months and repeat labs 2 weeks prior to appointment. Verbally acknowledged this and agreed with plan of care. Per his request, if he remains clinically stable we will move his appointments to every 6 months.  2. Left Shoulder Injury. Questionable rotator cuff tear to the left shoulder. We'll obtain basic left shoulder x-rays and if there is no abnormalities, we will refer him to orthopedics for further evaluation. Recommend warm packs with assisted range of motion exercises and ibuprofen 600 mg every 8 hours as needed for pain. Verbally acknowledged this and agreed with plan of care.  3. Tobacco Abuse. We'll write for nicotine patches 21 mg to be applied once daily as directed and request 28 patches. Recommend dose reduction. Following this, as per his primary care doctor. Verbally acknowledged this and agreed with plan of care.

## 2010-06-10 ENCOUNTER — Telehealth: Payer: Self-pay | Admitting: *Deleted

## 2010-06-10 NOTE — Progress Notes (Signed)
Addended by: Jennet Maduro on: 06/10/2010 11:21 AM   Modules accepted: Orders

## 2010-06-10 NOTE — Telephone Encounter (Signed)
Message left with information for Orthopedic Referral.  Dr. Isaias Cowman, [facility], [address] [phone].  Appt for Tuesday, Jun 24, 2010 @ 0915, pt to arrive at 0900.   Faxing referral info and xray results. Jennet Maduro, RN.

## 2010-06-12 ENCOUNTER — Encounter: Payer: Self-pay | Admitting: *Deleted

## 2010-10-09 ENCOUNTER — Other Ambulatory Visit (INDEPENDENT_AMBULATORY_CARE_PROVIDER_SITE_OTHER): Payer: Medicare Other

## 2010-10-09 DIAGNOSIS — Z23 Encounter for immunization: Secondary | ICD-10-CM

## 2010-10-09 DIAGNOSIS — Z79899 Other long term (current) drug therapy: Secondary | ICD-10-CM

## 2010-10-09 DIAGNOSIS — B20 Human immunodeficiency virus [HIV] disease: Secondary | ICD-10-CM

## 2010-10-09 DIAGNOSIS — S4992XA Unspecified injury of left shoulder and upper arm, initial encounter: Secondary | ICD-10-CM

## 2010-10-10 LAB — COMPLETE METABOLIC PANEL WITH GFR
ALT: 32 U/L (ref 0–53)
AST: 33 U/L (ref 0–37)
Albumin: 4.4 g/dL (ref 3.5–5.2)
BUN: 14 mg/dL (ref 6–23)
Calcium: 9.2 mg/dL (ref 8.4–10.5)
Chloride: 104 mEq/L (ref 96–112)
Potassium: 4.2 mEq/L (ref 3.5–5.3)
Sodium: 140 mEq/L (ref 135–145)
Total Protein: 7 g/dL (ref 6.0–8.3)

## 2010-10-10 LAB — CBC WITH DIFFERENTIAL/PLATELET
Basophils Absolute: 0 10*3/uL (ref 0.0–0.1)
HCT: 45.2 % (ref 39.0–52.0)
Hemoglobin: 15.6 g/dL (ref 13.0–17.0)
Lymphocytes Relative: 29 % (ref 12–46)
Monocytes Absolute: 0.5 10*3/uL (ref 0.1–1.0)
Neutro Abs: 3.9 10*3/uL (ref 1.7–7.7)
RDW: 12.7 % (ref 11.5–15.5)
WBC: 6.3 10*3/uL (ref 4.0–10.5)

## 2010-10-10 LAB — T-HELPER CELL (CD4) - (RCID CLINIC ONLY): CD4 % Helper T Cell: 24 % — ABNORMAL LOW (ref 33–55)

## 2010-10-27 ENCOUNTER — Encounter: Payer: Self-pay | Admitting: Infectious Diseases

## 2010-10-27 ENCOUNTER — Ambulatory Visit (INDEPENDENT_AMBULATORY_CARE_PROVIDER_SITE_OTHER): Payer: Medicare Other | Admitting: Infectious Diseases

## 2010-10-27 DIAGNOSIS — F17200 Nicotine dependence, unspecified, uncomplicated: Secondary | ICD-10-CM

## 2010-10-27 DIAGNOSIS — Z113 Encounter for screening for infections with a predominantly sexual mode of transmission: Secondary | ICD-10-CM

## 2010-10-27 DIAGNOSIS — Z720 Tobacco use: Secondary | ICD-10-CM

## 2010-10-27 DIAGNOSIS — Z79899 Other long term (current) drug therapy: Secondary | ICD-10-CM

## 2010-10-27 DIAGNOSIS — B20 Human immunodeficiency virus [HIV] disease: Secondary | ICD-10-CM

## 2010-10-27 NOTE — Assessment & Plan Note (Addendum)
He is doing well. Taking meds well, using condoms. Has gotten flu shot. Offered Hep A but he refuses. Will see him back in 5-6 months.

## 2010-10-27 NOTE — Assessment & Plan Note (Signed)
Not interested in quiting.  

## 2010-10-27 NOTE — Progress Notes (Signed)
  Subjective:    Patient ID: Cordarrel Stiefel, male    DOB: March 29, 1970, 40 y.o.   MRN: [id number]  HPI 40 yo M with HIV+ dx June 2006, currently on ATVr/TRV. Was on EFV/CBV but had difficulty with ADR from EFV. Last CD4 460, VL <20 (10-09-10). Also hx of urinary stricture.   Stress from being laid off.    Review of Systems  Constitutional: Negative for unexpected weight change.  Respiratory: Negative for cough and shortness of breath.   Gastrointestinal: Positive for constipation. Negative for diarrhea.  Genitourinary: Negative for dysuria.       Objective:   Physical Exam  Constitutional: He appears well-developed and well-nourished.  Eyes: EOM are normal. Pupils are equal, round, and reactive to light.  Neck: Neck supple.  Cardiovascular: Normal rate, regular rhythm and normal heart sounds.   Pulmonary/Chest: Effort normal and breath sounds normal.  Abdominal: Soft. Bowel sounds are normal. He exhibits no distension. There is no tenderness.  Lymphadenopathy:    He has no cervical adenopathy.  Skin:       Has evidence of skin ulceration, maceration between his 4 and 5th toes B          Assessment & Plan:

## 2010-12-05 ENCOUNTER — Other Ambulatory Visit: Payer: Self-pay | Admitting: Adult Health

## 2011-04-07 ENCOUNTER — Other Ambulatory Visit: Payer: Medicare Other

## 2011-04-07 DIAGNOSIS — B20 Human immunodeficiency virus [HIV] disease: Secondary | ICD-10-CM

## 2011-04-07 DIAGNOSIS — Z113 Encounter for screening for infections with a predominantly sexual mode of transmission: Secondary | ICD-10-CM

## 2011-04-07 DIAGNOSIS — Z79899 Other long term (current) drug therapy: Secondary | ICD-10-CM

## 2011-04-07 LAB — CBC
HCT: 45.8 % (ref 39.0–52.0)
Hemoglobin: 16 g/dL (ref 13.0–17.0)
MCH: 34.2 pg — ABNORMAL HIGH (ref 26.0–34.0)
MCV: 97.9 fL (ref 78.0–100.0)
RBC: 4.68 MIL/uL (ref 4.22–5.81)

## 2011-04-07 LAB — COMPREHENSIVE METABOLIC PANEL
BUN: 9 mg/dL (ref 6–23)
CO2: 28 mEq/L (ref 19–32)
Glucose, Bld: 96 mg/dL (ref 70–99)
Sodium: 142 mEq/L (ref 135–145)
Total Bilirubin: 3.9 mg/dL — ABNORMAL HIGH (ref 0.3–1.2)
Total Protein: 6.9 g/dL (ref 6.0–8.3)

## 2011-04-07 LAB — RPR

## 2011-04-07 LAB — LIPID PANEL: HDL: 39 mg/dL — ABNORMAL LOW (ref >39)

## 2011-04-08 ENCOUNTER — Other Ambulatory Visit: Payer: Medicare Other

## 2011-04-09 ENCOUNTER — Other Ambulatory Visit: Payer: Self-pay | Admitting: Internal Medicine

## 2011-04-09 ENCOUNTER — Other Ambulatory Visit: Payer: Self-pay | Admitting: *Deleted

## 2011-04-09 ENCOUNTER — Ambulatory Visit (INDEPENDENT_AMBULATORY_CARE_PROVIDER_SITE_OTHER): Payer: Medicare Other | Admitting: Internal Medicine

## 2011-04-09 ENCOUNTER — Telehealth: Payer: Self-pay | Admitting: *Deleted

## 2011-04-09 ENCOUNTER — Encounter: Payer: Self-pay | Admitting: Internal Medicine

## 2011-04-09 VITALS — BP 135/84 | HR 76 | Temp 97.5°F | Wt 184.0 lb

## 2011-04-09 DIAGNOSIS — B20 Human immunodeficiency virus [HIV] disease: Secondary | ICD-10-CM

## 2011-04-09 DIAGNOSIS — D409 Neoplasm of uncertain behavior of male genital organ, unspecified: Secondary | ICD-10-CM

## 2011-04-09 DIAGNOSIS — Z21 Asymptomatic human immunodeficiency virus [HIV] infection status: Secondary | ICD-10-CM

## 2011-04-09 DIAGNOSIS — N489 Disorder of penis, unspecified: Secondary | ICD-10-CM

## 2011-04-09 LAB — CBC WITH DIFFERENTIAL/PLATELET
Lymphocytes Relative: 24 % (ref 12–46)
Lymphs Abs: 1.4 10*3/uL (ref 0.7–4.0)
Neutro Abs: 3.9 10*3/uL (ref 1.7–7.7)
Neutrophils Relative %: 67 % (ref 43–77)
Platelets: 152 10*3/uL (ref 150–400)
RBC: 4.48 MIL/uL (ref 4.22–5.81)
WBC: 5.8 10*3/uL (ref 4.0–10.5)

## 2011-04-09 MED ORDER — PODOFILOX 0.5 % EX GEL: Freq: Two times a day (BID) | CUTANEOUS | Status: DC

## 2011-04-09 MED ORDER — PODOFILOX 0.5 % EX GEL: Freq: Two times a day (BID) | CUTANEOUS | Status: AC
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
## 2011-04-09 NOTE — Progress Notes (Signed)
[facility] send rx to [facility] for podofilox 0.5% gel BID x 3 days, then stop for 4 days, can re-apply up to 4 cycles for genital warts

## 2011-04-09 NOTE — Telephone Encounter (Signed)
Will call the Rx in to pharmacy as it can not be escribed.

## 2011-04-09 NOTE — Telephone Encounter (Signed)
Message copied by Macy Mis on Thu Apr 09, 2011  9:19 AM ------      Message from: HATCHER, JEFFREY C      Created: Wed Apr 08, 2011  5:39 PM       Please have pt seen in clinic as soon as possible

## 2011-04-09 NOTE — Telephone Encounter (Signed)
Disregard previous phone note, patient coming in today to see Dr. Duayne Cal CMA

## 2011-04-09 NOTE — Telephone Encounter (Signed)
Received note from Dr. Ninetta Lights to have patient seen in clinic as soon as possible regarding his CD4.  Called patient and he was very concerned, told him there may be an error in his labs that need to be repeated, he was scheduled with Dr. Daiva Eves for tomorrow. Wendall Mola CMA

## 2011-04-10 ENCOUNTER — Ambulatory Visit: Payer: Medicare Other | Admitting: Infectious Disease

## 2011-04-10 ENCOUNTER — Telehealth: Payer: Self-pay | Admitting: *Deleted

## 2011-04-10 LAB — T-HELPER CELL (CD4) - (RCID CLINIC ONLY)
CD4 % Helper T Cell: 24 % — ABNORMAL LOW (ref 33–55)
CD4 T Cell Abs: 310 uL — ABNORMAL LOW (ref 400–2700)
CD4 T Cell Abs: 350 uL — ABNORMAL LOW (ref 400–2700)

## 2011-04-10 NOTE — Telephone Encounter (Signed)
Patient called to see if the results to his repeat CD4 are. Advised patient that will have the provider review the labs and that someone will call him Monday 04/13/11 once all is settled. Advised him to try not to worry about the results as he is anxious. He understood and will wait for the call.

## 2011-04-13 ENCOUNTER — Telehealth: Payer: Self-pay | Admitting: *Deleted

## 2011-04-13 NOTE — Telephone Encounter (Signed)
Patient returned call regarding his labs.  Results given and explained the previous results on his CD4 was an error. Wendall Mola CMA

## 2011-04-22 ENCOUNTER — Ambulatory Visit (INDEPENDENT_AMBULATORY_CARE_PROVIDER_SITE_OTHER): Payer: Medicare Other | Admitting: Infectious Diseases

## 2011-04-22 ENCOUNTER — Encounter: Payer: Self-pay | Admitting: Infectious Diseases

## 2011-04-22 VITALS — BP 130/81 | HR 84 | Temp 97.6°F | Ht 71.0 in | Wt 185.8 lb

## 2011-04-22 DIAGNOSIS — B20 Human immunodeficiency virus [HIV] disease: Secondary | ICD-10-CM

## 2011-04-22 DIAGNOSIS — G4700 Insomnia, unspecified: Secondary | ICD-10-CM

## 2011-04-22 DIAGNOSIS — D409 Neoplasm of uncertain behavior of male genital organ, unspecified: Secondary | ICD-10-CM

## 2011-04-22 MED ORDER — TEMAZEPAM 15 MG PO CAPS: 15.0000 mg | ORAL_CAPSULE | Freq: Every evening | ORAL | Status: AC | PRN

## 2011-04-22 NOTE — Assessment & Plan Note (Signed)
Doing well. Will cont his current art. He is given condoms. vax up to date. Will see him back in 5-6 months.

## 2011-04-22 NOTE — Progress Notes (Signed)
  Subjective:    Patient ID: Darrell Schwartz, male    DOB: Jul 04, 1970, 41 y.o.   MRN: [id number]  HPI 41 yo M with HIV+ dx June 2006, currently on ATVr/TRV. Was on EFV/CBV but had difficulty with ADR from EFV. Also hx of urinary stricture, penile warts.    HIV 1 RNA Quant (copies/mL)  Date Value  04/07/2011 <20   10/09/2010 <20   05/16/2010 <20      CD4 T Cell Abs (cmm)  Date Value  04/09/2011 350*  04/07/2011 310*  10/09/2010 460     Seen semi acutely earlier this month for precipitous drop in CD4. Last CD4 has shown that this has returned to his previous level.  Has trouble with insomnia, prev took [place] but was arrested for sleep walking/driving. Can't sleep as his mind is racing, takes prn xanax.      Review of Systems  Constitutional: Negative for appetite change and unexpected weight change.  Gastrointestinal: Negative for constipation.  Genitourinary: Positive for penile swelling. Negative for dysuria.       Objective:   Physical Exam  Constitutional: He appears well-developed and well-nourished.  HENT:  Mouth/Throat: No oropharyngeal exudate.  Eyes: EOM are normal. Pupils are equal, round, and reactive to light.  Neck: Neck supple.  Cardiovascular: Normal rate, regular rhythm and normal heart sounds.   Pulmonary/Chest: Effort normal and breath sounds normal.  Abdominal: Soft. Bowel sounds are normal. He exhibits no distension. There is no tenderness.  Genitourinary:     Lymphadenopathy:    He has no cervical adenopathy.          Assessment & Plan:

## 2011-04-22 NOTE — Assessment & Plan Note (Signed)
Needs help with falling asleep, anxiety driven. Will try restoril as Darrell Schwartz gave him somnabulism.

## 2011-04-22 NOTE — Assessment & Plan Note (Signed)
Will have him seen by derm, not clear this is warts. Hold on podoxyfilin for now.

## 2011-06-02 ENCOUNTER — Other Ambulatory Visit: Payer: Self-pay | Admitting: Infectious Diseases

## 2011-06-15 ENCOUNTER — Ambulatory Visit: Payer: Medicare Other

## 2011-06-17 ENCOUNTER — Ambulatory Visit: Payer: Medicare Other

## 2011-06-17 ENCOUNTER — Other Ambulatory Visit: Payer: Self-pay | Admitting: *Deleted

## 2011-06-17 DIAGNOSIS — B20 Human immunodeficiency virus [HIV] disease: Secondary | ICD-10-CM

## 2011-06-17 MED ORDER — RITONAVIR 100 MG PO CAPS: 100.0000 mg | ORAL_CAPSULE | Freq: Every day | ORAL | Status: DC

## 2011-06-17 MED ORDER — ATAZANAVIR SULFATE 300 MG PO CAPS: 300.0000 mg | ORAL_CAPSULE | Freq: Every day | ORAL | Status: DC

## 2011-06-17 MED ORDER — EMTRICITABINE-TENOFOVIR DF 200-300 MG PO TABS: 1.0000 | ORAL_TABLET | Freq: Every day | ORAL | Status: DC

## 2011-06-30 ENCOUNTER — Other Ambulatory Visit: Payer: Self-pay | Admitting: Licensed Clinical Social Worker

## 2011-06-30 DIAGNOSIS — B20 Human immunodeficiency virus [HIV] disease: Secondary | ICD-10-CM

## 2011-06-30 MED ORDER — RITONAVIR 100 MG PO TABS: 100.0000 mg | ORAL_TABLET | Freq: Every day | ORAL | Status: DC

## 2011-06-30 MED ORDER — EMTRICITABINE-TENOFOVIR DF 200-300 MG PO TABS: 1.0000 | ORAL_TABLET | Freq: Every day | ORAL | Status: DC

## 2011-06-30 MED ORDER — ATAZANAVIR SULFATE 300 MG PO CAPS: 300.0000 mg | ORAL_CAPSULE | Freq: Every day | ORAL | Status: DC

## 2011-07-01 ENCOUNTER — Other Ambulatory Visit: Payer: Self-pay | Admitting: *Deleted

## 2011-07-01 DIAGNOSIS — B20 Human immunodeficiency virus [HIV] disease: Secondary | ICD-10-CM

## 2011-07-01 MED ORDER — ATAZANAVIR SULFATE 300 MG PO CAPS: 300.0000 mg | ORAL_CAPSULE | Freq: Every day | ORAL | Status: DC

## 2011-07-01 MED ORDER — EMTRICITABINE-TENOFOVIR DF 200-300 MG PO TABS: 1.0000 | ORAL_TABLET | Freq: Every day | ORAL | Status: DC

## 2011-07-01 MED ORDER — RITONAVIR 100 MG PO TABS: 100.0000 mg | ORAL_TABLET | Freq: Every day | ORAL | Status: DC

## 2011-07-01 NOTE — Telephone Encounter (Signed)
I spoke with him & told him he has been approved for free meds for [name]. I told him they will be mailed to him. He states he has been out of meds for 11 days. We do not have norvir samples to give him. He will resume all meds when they arrive

## 2011-09-09 ENCOUNTER — Other Ambulatory Visit: Payer: Medicare Other

## 2011-09-23 ENCOUNTER — Other Ambulatory Visit: Payer: Medicare Other

## 2011-09-23 ENCOUNTER — Ambulatory Visit: Payer: Medicare Other | Admitting: Infectious Diseases

## 2011-09-23 ENCOUNTER — Ambulatory Visit: Payer: Medicare Other

## 2011-09-23 ENCOUNTER — Telehealth: Payer: Self-pay | Admitting: *Deleted

## 2011-09-23 NOTE — Telephone Encounter (Signed)
Called patient and left voice mail to call the clinic to reschedule his lab appt. Darrell Schwartz

## 2011-10-12 IMAGING — CR DG SHOULDER 2+V*L*
3 series · 3 of 3 positions shown · non-contrast
Comparison: None.

CLINICAL DATA: Pain post fall

LEFT SHOULDER - 2+ VIEW

[w shoulder ap internal left]
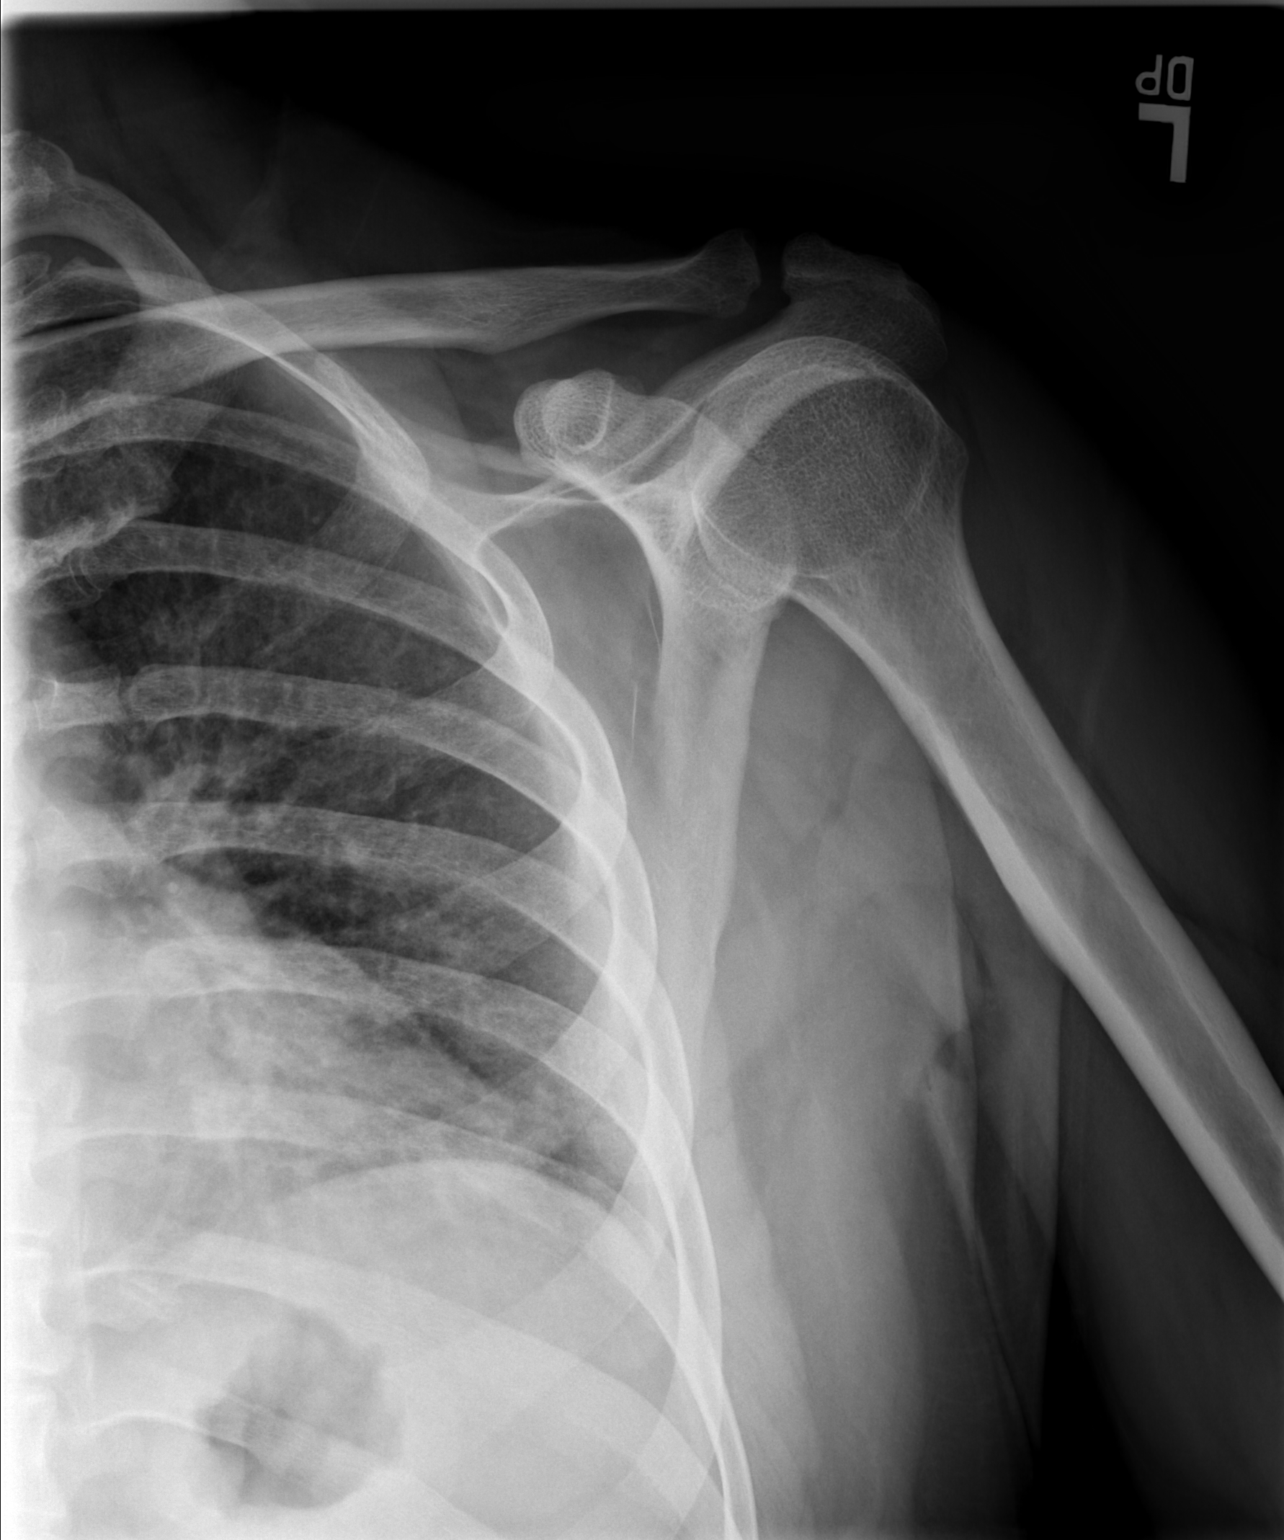

[w shoulder ap external left]
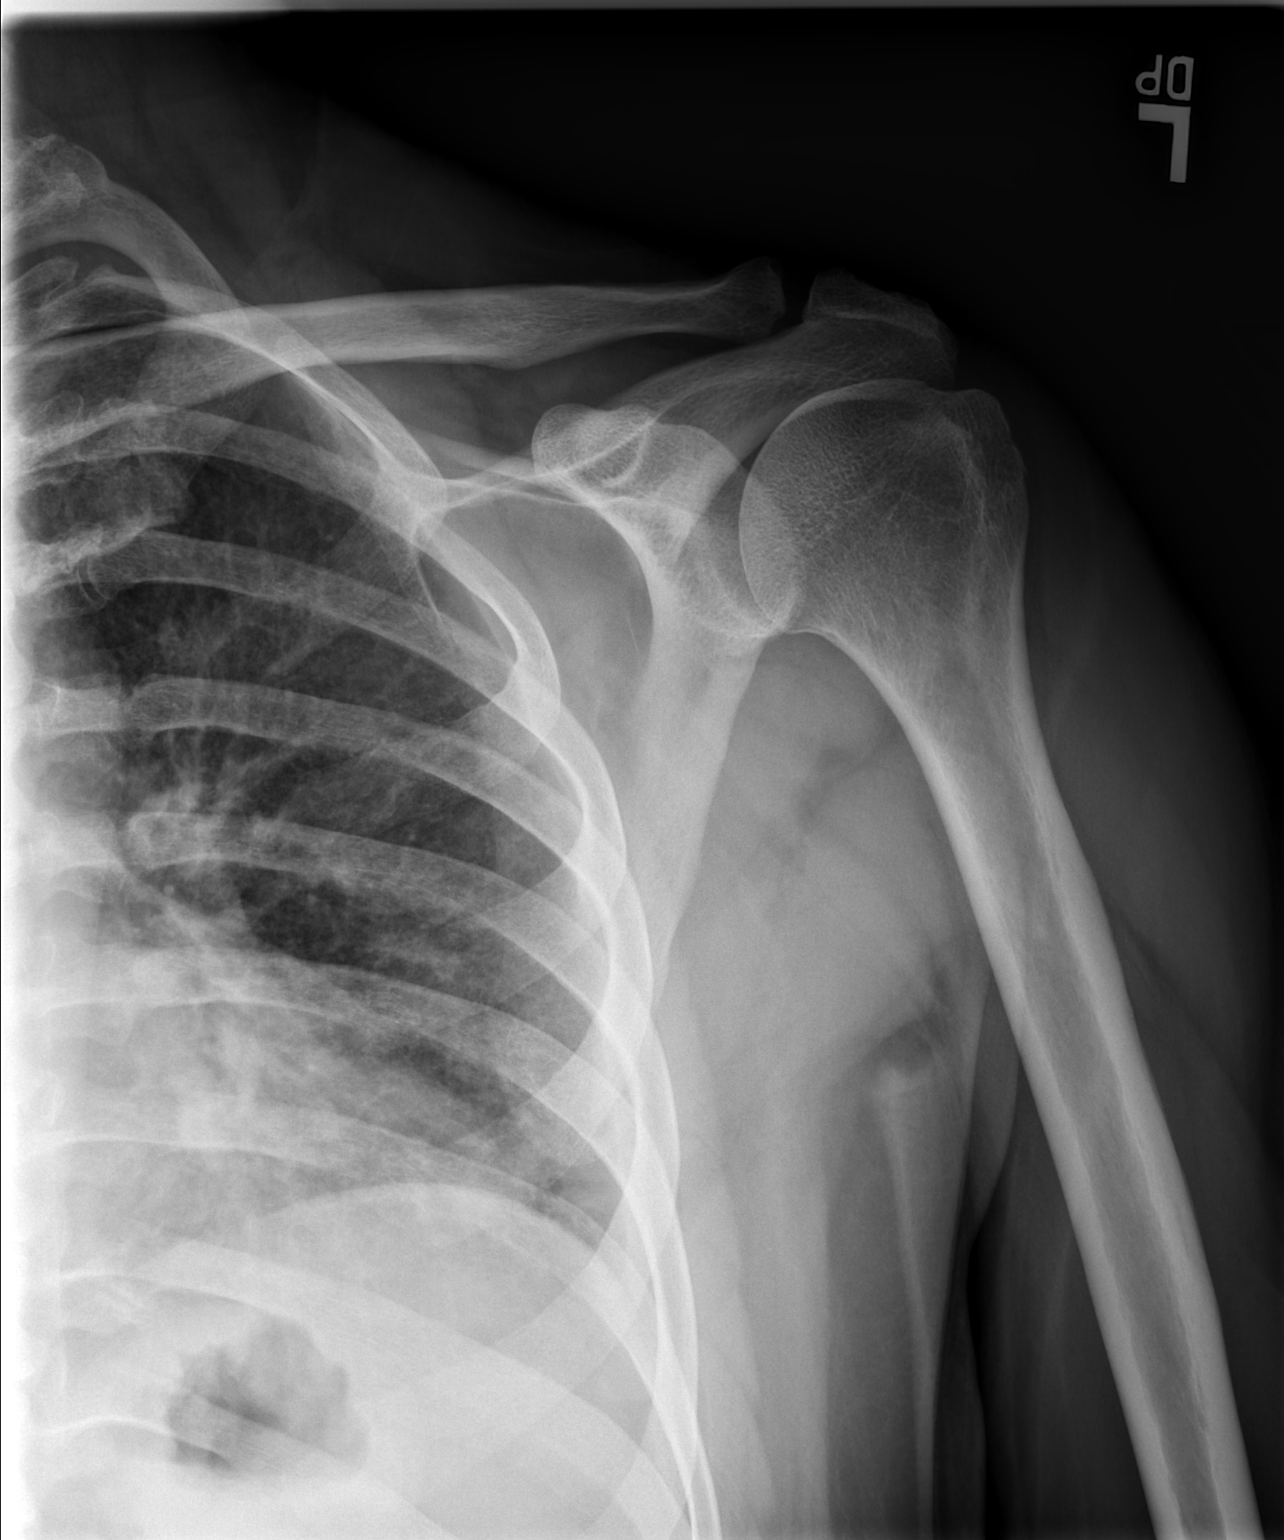

[w shoulder y view left]
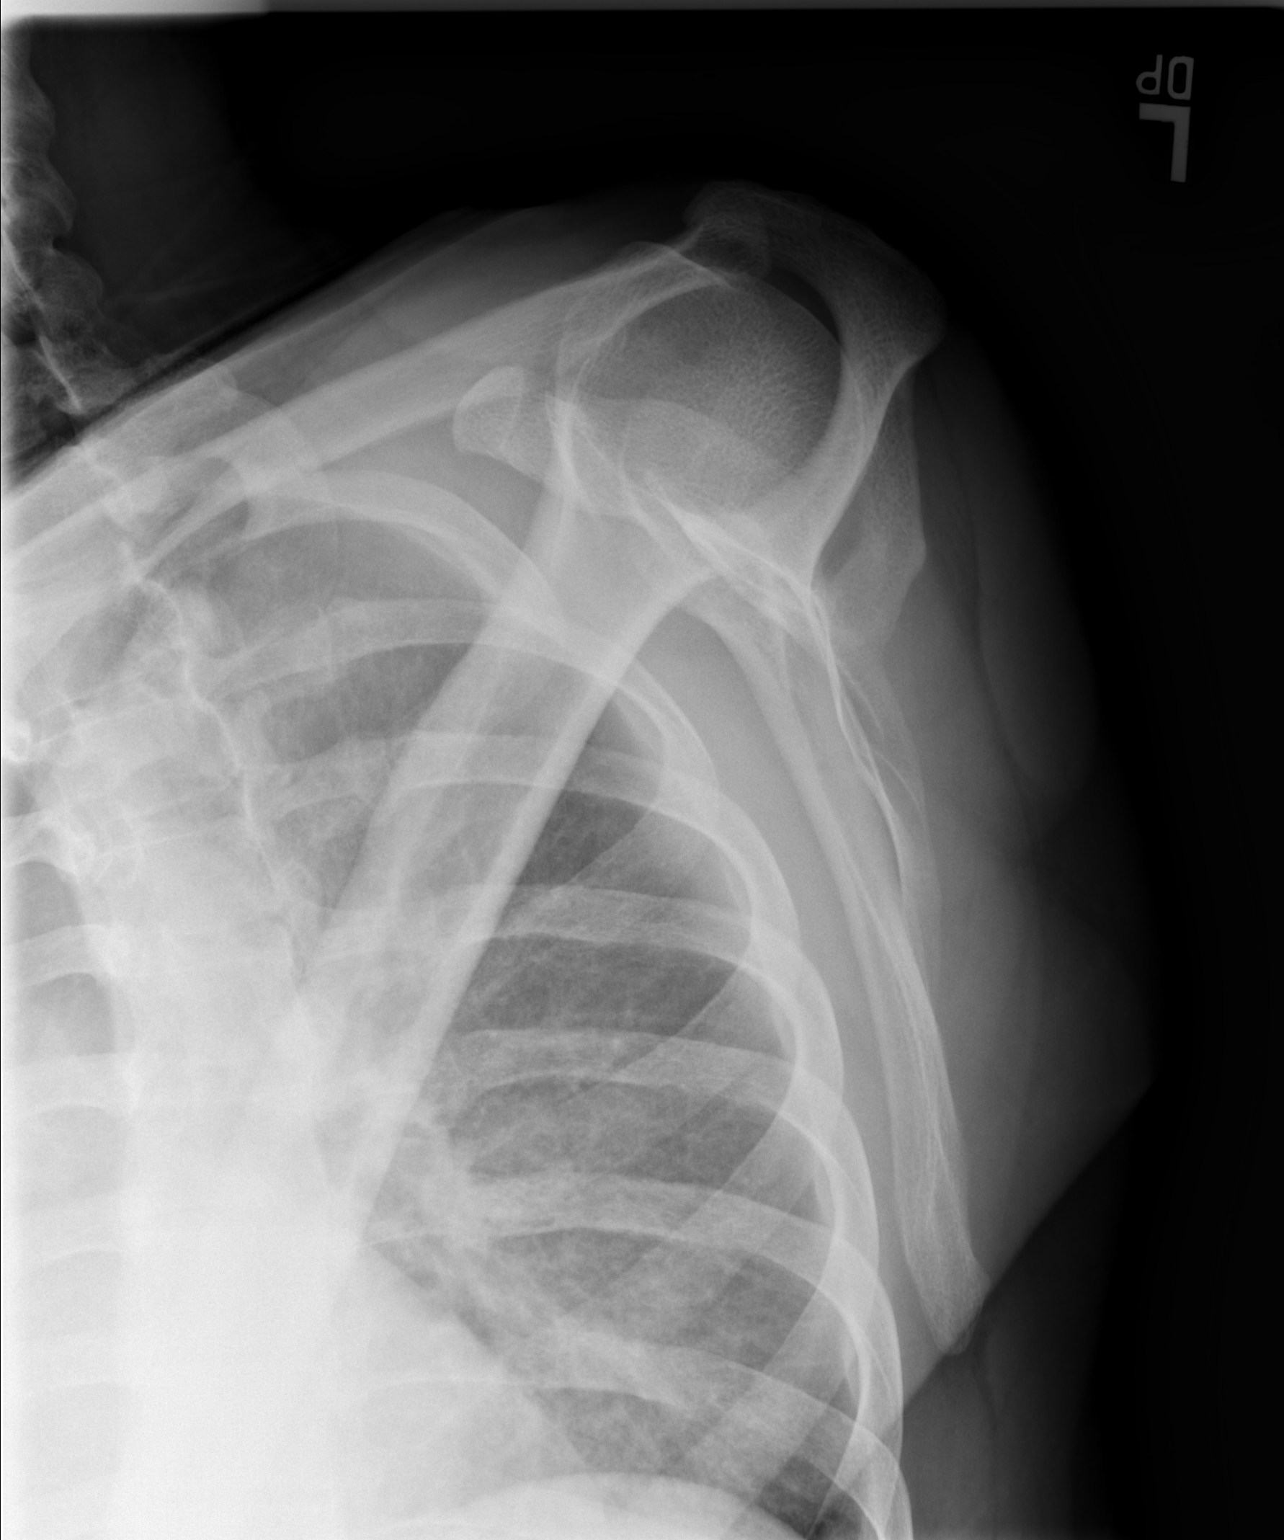

[3 of 3 positions shown; findings below may reference images not displayed]

FINDINGS: Negative for fracture, dislocation, or other acute
abnormality.  Normal alignment and mineralization. No significant
degenerative change.  Regional soft tissues unremarkable.
IMPRESSION: Negative

## 2011-10-27 ENCOUNTER — Ambulatory Visit: Payer: Self-pay

## 2011-10-27 ENCOUNTER — Encounter: Payer: Self-pay | Admitting: Infectious Diseases

## 2011-10-27 ENCOUNTER — Ambulatory Visit (INDEPENDENT_AMBULATORY_CARE_PROVIDER_SITE_OTHER): Payer: Self-pay | Admitting: Infectious Diseases

## 2011-10-27 VITALS — BP 106/71 | HR 66 | Temp 98.0°F | Ht 70.0 in | Wt 169.5 lb

## 2011-10-27 DIAGNOSIS — Z23 Encounter for immunization: Secondary | ICD-10-CM

## 2011-10-27 DIAGNOSIS — J329 Chronic sinusitis, unspecified: Secondary | ICD-10-CM | POA: Insufficient documentation

## 2011-10-27 DIAGNOSIS — B20 Human immunodeficiency virus [HIV] disease: Secondary | ICD-10-CM

## 2011-10-27 DIAGNOSIS — F341 Dysthymic disorder: Secondary | ICD-10-CM

## 2011-10-27 MED ORDER — AZITHROMYCIN 250 MG PO TABS: ORAL_TABLET | ORAL | Status: DC

## 2011-10-27 MED ORDER — PREDNISONE 5 MG PO KIT: 5.0000 mg | PACK | Freq: Every day | ORAL | Status: DC

## 2011-10-27 NOTE — Assessment & Plan Note (Signed)
See depression.... Will continue his current meds, he has some GI upset with them but does not state he is missing them. Will check his labs today. [place] him [place] in 3[place]4 [place]

## 2011-10-27 NOTE — Assessment & Plan Note (Signed)
Will have him seen by mental health counselor in clinic. He is very frustrated with his wife, kids (dropped out of school), father (getting divorced). He is tired of taking pills, very easy to become agitated. He is clear that he has taken anti-depressants before and does not want to take them again. He is also clear that taking the xanax during the day does not help his agitation.

## 2011-10-27 NOTE — Assessment & Plan Note (Signed)
Wants z-pack and steroids. Probably allergic.

## 2011-10-27 NOTE — Progress Notes (Signed)
  Subjective:    Patient ID: Darrell Schwartz, male    DOB: July 05, 1970, 41 y.o.   MRN: [id number]  HPI HPI  41 yo M with HIV+ dx June 2006, currently on ATVr/TRV. Was on EFV/CBV but had difficulty with ADR from EFV. Also hx of urinary stricture, penile warts.  C/o stress from wife today. She recently quit her job.   HIV 1 RNA Quant (copies/mL)  Date Value  04/07/2011 <20   10/09/2010 <20   05/16/2010 <20      CD4 T Cell Abs (cmm)  Date Value  04/09/2011 350*  04/07/2011 310*  10/09/2010 460    Has not had blood work done yet. Wart on his penis has resolved.    Review of Systems  Constitutional: Positive for unexpected weight change. Negative for appetite change.  Gastrointestinal: Negative for diarrhea and constipation.  Genitourinary: Negative for dysuria.  Psychiatric/Behavioral: Positive for dysphoric mood and agitation.       Objective:   Physical Exam  Constitutional: He appears well-developed and well-nourished.  Eyes: EOM are normal. Pupils are equal, round, and reactive to light.  Neck: Neck supple.  Cardiovascular: Normal rate, regular rhythm and normal heart sounds.   Pulmonary/Chest: Effort normal and breath sounds normal.  Abdominal: Soft. Bowel sounds are normal. He exhibits no distension.  Lymphadenopathy:    He has no cervical adenopathy.  Psychiatric: His mood appears anxious. He exhibits a depressed mood.          Assessment & Plan:

## 2011-10-28 LAB — HIV-1 RNA ULTRAQUANT REFLEX TO GENTYP+: HIV-1 RNA Quant, Log: <1.3 {Log} (ref <1.30)

## 2011-10-30 ENCOUNTER — Telehealth: Payer: Self-pay

## 2011-10-30 NOTE — Telephone Encounter (Signed)
Patient came in for lab and had been called and put in schedule to recertify for RW and ADAP - he came in and said he got his Medicare and part D back - asked if he brought documentation and he said no, nothing had changed - I went and pulled his chart and asked about spouse's income - he became angry and said she had not worked since July 2013 - I said we still needed to include her income as she still worked during 2013 and would be included in the 6 month formula - he said he could not get it and said he did not need to recertify anyway, that most of his meds were being paid for and thought it was ridiculous to have to "do this ???@#!!  thing every 6 months!!", and I explained it was an ADAP requirement - found out from [place] that his wife had recently left and he was very depressed - he is seeing counselor - will try again and see if he wants to apply at a later time.

## 2011-11-03 ENCOUNTER — Ambulatory Visit: Payer: Self-pay

## 2011-11-03 ENCOUNTER — Telehealth: Payer: Self-pay | Admitting: Infectious Diseases

## 2011-11-03 NOTE — Telephone Encounter (Signed)
Spoke with patient regarding his email requesting a refill on his z-pak, advised him that it is still working for 10 days and he just got the first one 10/27/11. Also advised him to establish with a PCP because he told me he is insured through [employer].  He said his previous ID practice handled all his issues and we should be able to do the same.  He said the mayor of [address] was an HIV advocate and he would contact an HIV advocate here to find out why we couldn't handle all his issues. Tried to explain that Dr. Ninetta Lights is not here everyday and due to his "multiple pain issues and anxiety" that he speaks of in the email it would be beneficial to have a PCP.  Told him I would speak to Dr. Ninetta Lights tomorrow regarding his request for a z-pak refill and a prescription for Xanax. Wendall Mola CMA

## 2011-11-04 ENCOUNTER — Other Ambulatory Visit: Payer: Self-pay | Admitting: *Deleted

## 2011-11-04 DIAGNOSIS — F419 Anxiety disorder, unspecified: Secondary | ICD-10-CM

## 2011-11-04 DIAGNOSIS — J329 Chronic sinusitis, unspecified: Secondary | ICD-10-CM

## 2011-11-04 MED ORDER — AZITHROMYCIN 250 MG PO TABS: ORAL_TABLET | ORAL | Status: DC

## 2011-11-04 MED ORDER — ALPRAZOLAM 1 MG PO TABS: 1.0000 mg | ORAL_TABLET | Freq: Four times a day (QID) | ORAL | Status: DC | PRN

## 2011-11-04 NOTE — Telephone Encounter (Signed)
Darrell Schwartz be ok to refill his xanax and azithro. Needs pain clinic referral.  Darrell Schwartz- we are trying to get everyone established with a primary care MD in our practice. This helps if you need to be hospitalized and will make it easier for other MDs to know who will take care of you in the hospital. In the hospital, your infection doctors are a consulting service, so you would need another MD to be your primary/admitting doctor. I hope that this makes sense. thanks

## 2011-11-05 ENCOUNTER — Telehealth: Payer: Self-pay | Admitting: *Deleted

## 2011-11-05 NOTE — Telephone Encounter (Signed)
Notified pt this can not be done as Release has to be witnessed, he will come Monday to sign here. Darrell Schwartz

## 2011-11-05 NOTE — Telephone Encounter (Signed)
Patient returned call from [place] and gave Fax number of [phone] for her to fax him a ROI he advised he will fill it out, sign it and fax it back as soon as he gets it. He advised he lives to far to come just for a form.

## 2011-11-05 NOTE — Telephone Encounter (Signed)
Called and left message with patient's wife to return my call.  Trying to refer pt to a pain clinic and they want office notes regarding his pain.  I went back 1 year and could not find any mention of pain in the notes.  He said he is on pain medication from a previous MD and I don't have a list of these meds either.  Would like for him to sign a release from his previous pain MD and have records forwarded here or to [facility] pain clinic.  I do not think they will agree to see him without any records discussing his pain issues. Wendall Mola CMA

## 2011-11-10 ENCOUNTER — Ambulatory Visit: Payer: Self-pay

## 2012-01-20 ENCOUNTER — Other Ambulatory Visit: Payer: Self-pay | Admitting: Infectious Diseases

## 2012-01-22 ENCOUNTER — Telehealth: Payer: Self-pay | Admitting: *Deleted

## 2012-01-22 NOTE — Telephone Encounter (Signed)
Called and spoke with pharmacist at [facility], Z-pak approved in error, should have been denied.  Patient last seen 10/2011 if he is sick he needs to schedule appt to be seen. Darrell Schwartz CMA

## 2012-02-10 ENCOUNTER — Ambulatory Visit: Payer: Self-pay

## 2012-02-10 ENCOUNTER — Telehealth: Payer: Self-pay | Admitting: Infectious Diseases

## 2012-02-10 ENCOUNTER — Other Ambulatory Visit: Payer: Self-pay | Admitting: *Deleted

## 2012-02-10 DIAGNOSIS — B20 Human immunodeficiency virus [HIV] disease: Secondary | ICD-10-CM

## 2012-02-10 MED ORDER — ATAZANAVIR SULFATE 300 MG PO CAPS: 300.0000 mg | ORAL_CAPSULE | Freq: Every day | ORAL | Status: DC

## 2012-02-10 MED ORDER — EMTRICITABINE-TENOFOVIR DF 200-300 MG PO TABS: 1.0000 | ORAL_TABLET | Freq: Every day | ORAL | Status: DC

## 2012-02-10 MED ORDER — RITONAVIR 100 MG PO TABS: 100.0000 mg | ORAL_TABLET | Freq: Every day | ORAL | Status: DC

## 2012-02-10 NOTE — Telephone Encounter (Signed)
Received call from Darrell Schwartz.  He was told by Darrell Schwartz to contact me.  He is in need of his medication.  I got the emergency supply of Truvada.  There was a bottle of Norvir 100 mg and a bottle of Reyataz 300 mg in the back.  Called Darrell Schwartz and told him we have the medications he needs.  He has re-applied for ADAP.

## 2012-03-07 ENCOUNTER — Other Ambulatory Visit: Payer: Self-pay | Admitting: *Deleted

## 2012-03-07 DIAGNOSIS — B20 Human immunodeficiency virus [HIV] disease: Secondary | ICD-10-CM

## 2012-03-07 MED ORDER — ATAZANAVIR SULFATE 300 MG PO CAPS: 300.0000 mg | ORAL_CAPSULE | Freq: Every day | ORAL | Status: DC

## 2012-03-07 MED ORDER — RITONAVIR 100 MG PO TABS: 100.0000 mg | ORAL_TABLET | Freq: Every day | ORAL | Status: DC

## 2012-03-07 MED ORDER — EMTRICITABINE-TENOFOVIR DF 200-300 MG PO TABS: 1.0000 | ORAL_TABLET | Freq: Every day | ORAL | Status: DC

## 2012-03-07 NOTE — Telephone Encounter (Signed)
Patient ADAP was approved. 

## 2012-03-22 ENCOUNTER — Ambulatory Visit: Payer: Self-pay | Admitting: Infectious Diseases

## 2012-03-28 ENCOUNTER — Telehealth: Payer: Self-pay | Admitting: *Deleted

## 2012-03-28 NOTE — Telephone Encounter (Signed)
Patient called to let us know his PCP Dr. Link Snuffer has dismissed him from [facility] because of his "non compliance and multiple no-shows" with [facility].  Patient states that his PCP is the one handling his adderall, xanax, and percocet prescriptions and he is out of all of these.  Patient asked if we could clear this matter up with his doctor.  RN called [facility] and left a message with Dr. Alphonsus Sias CMA that only 2 of these appointments were with our therapist, Franne Forts, and the others were for financial counseling with [facility].  RN advised patient to find another PCP to continue managing these prescriptions.  Patient states he doesn't have insurance and has been dropped from disability.  RN advised him of the [facility] and advised him to call there.  Patient wants to know if Dr. Ninetta Lights can do anything in the mean time.  Please advise. Andree Coss, RN

## 2012-03-28 NOTE — Telephone Encounter (Signed)
Would continue his search for PCP. No other changes. thanks

## 2012-03-29 ENCOUNTER — Encounter (HOSPITAL_COMMUNITY): Payer: Self-pay

## 2012-03-29 ENCOUNTER — Emergency Department (INDEPENDENT_AMBULATORY_CARE_PROVIDER_SITE_OTHER)
Admission: EM | Admit: 2012-03-29 | Discharge: 2012-03-29 | Disposition: A | Discharge status: Home / Self Care | Payer: Self-pay | Source: Home / Self Care

## 2012-03-29 DIAGNOSIS — J069 Acute upper respiratory infection, unspecified: Secondary | ICD-10-CM

## 2012-03-29 DIAGNOSIS — F1910 Other psychoactive substance abuse, uncomplicated: Secondary | ICD-10-CM

## 2012-03-29 DIAGNOSIS — J329 Chronic sinusitis, unspecified: Secondary | ICD-10-CM

## 2012-03-29 LAB — CBC
HCT: 42 % (ref 39.0–52.0)
Hemoglobin: 15.3 g/dL (ref 13.0–17.0)
MCH: 34.9 pg — ABNORMAL HIGH (ref 26.0–34.0)
MCHC: 36.4 g/dL — ABNORMAL HIGH (ref 30.0–36.0)
MCV: 95.9 fL (ref 78.0–100.0)
RBC: 4.38 MIL/uL (ref 4.22–5.81)

## 2012-03-29 MED ORDER — AZITHROMYCIN 250 MG PO TABS: ORAL_TABLET | ORAL | Status: DC

## 2012-03-29 MED ORDER — ALBUTEROL SULFATE HFA 108 (90 BASE) MCG/ACT IN AERS: 2.0000 | INHALATION_SPRAY | Freq: Four times a day (QID) | RESPIRATORY_TRACT | Status: DC | PRN

## 2012-03-29 MED ORDER — OXYCODONE-ACETAMINOPHEN 7.5-325 MG PO TABS: 1.0000 | ORAL_TABLET | ORAL | Status: DC | PRN

## 2012-03-29 NOTE — ED Notes (Signed)
In record as requested by primary nurse-answer question

## 2012-03-29 NOTE — ED Notes (Signed)
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

## 2012-03-29 NOTE — ED Provider Notes (Signed)
History     CSN: [id number]  Arrival date & time 03/29/12  5540  42 year old male with a history of HIV, under the care of Dr. Ninetta Lights. Previously on disability, who presents to urgent care for evaluation of acute sinusitis and a refill of his medications. He states that he had a fever 101 yesterday. Today is afebrile. The patient denies any productive cough. He denies any chills. He denies any shortness of breath or wheezing. Denies any headache.   The patient has an upcoming appointment with a psychiatrist and I have advised him to fill his Xanax and Adderall at psychiatry clinic    Chief Complaint  Patient presents with  . URI    (Consider location/radiation/quality/duration/timing/severity/associated sxs/prior treatment) HPI  Past Medical History  Diagnosis Date  . HIV infection   . Anxiety     History reviewed. No pertinent past surgical history.  Family History  Problem Relation Age of Onset  . Heart disease Mother   . Heart disease Father     History  Substance Use Topics  . Smoking status: Current Every Day Smoker -- 0.50 packs/day for 17 years    Types: Cigarettes  . Smokeless tobacco: Never Used  . Alcohol Use: 1.0 oz/week    2 drink(s) per week     Comment: rarely      Review of Systems  Allergies  Review of patient's allergies indicates no known allergies.  Home Medications   Current Outpatient Rx  Name  Route  Sig  Dispense  Refill  . albuterol (PROVENTIL HFA;VENTOLIN HFA) 108 (90 BASE) MCG/ACT inhaler   Inhalation   Inhale 2 puffs into the lungs every 6 (six) hours as needed for wheezing.   1 Inhaler   2   . ALPRAZolam (XANAX) 1 MG tablet   Oral   Take 1 tablet (1 mg total) by mouth 4 (four) times daily as needed.   120 tablet   0   . atazanavir (REYATAZ) 300 MG capsule   Oral   Take 1 capsule (300 mg total) by mouth daily.   30 capsule   11   . azithromycin (ZITHROMAX Z-PAK) 250 MG tablet      Per dosepak instructions   6 each    1   . azithromycin (ZITHROMAX) 250 MG tablet      2 Po on day 1, then 1 po on days 2-5   6 each   0   . azithromycin (ZITHROMAX) 250 MG tablet      TAKE TWO TABLETS AT ONCE TODAY , THEN TAKE ONE TABLET - DAILY FOR FOUR DAYS   6 tablet   0   . ciprofloxacin (CIPRO) 500 MG tablet   Oral   Take 500 mg by mouth 2 (two) times daily.           Marland Kitchen emtricitabine-tenofovir (TRUVADA) 200-300 MG per tablet   Oral   Take 1 tablet by mouth daily.   30 tablet   11   . oxyCODONE-acetaminophen (PERCOCET) 7.5-325 MG per tablet   Oral   Take 1 tablet by mouth every 4 (four) hours as needed for pain.   30 tablet   0   . PredniSONE 5 MG KIT   Oral   Take 1 kit (5 mg total) by mouth daily with lunch.   55 each   0   . ritonavir (NORVIR) 100 MG TABS   Oral   Take 1 tablet (100 mg total) by mouth daily with breakfast.  30 tablet   11     BP 131/88  Pulse 69  Temp(Src) 97.7 F (36.5 C) (Oral)  SpO2 100%  Physical Exam Eyes: EOM are normal. Pupils are equal, round, and reactive to light.  Neck: Neck supple.  Cardiovascular: Normal rate, regular rhythm and normal heart sounds.  Pulmonary/Chest: Effort normal and breath sounds normal.  Abdominal: Soft. Bowel sounds are normal. He exhibits no distension.  Lymphadenopathy:  He has no cervical adenopathy.  Psychiatric: His mood appears anxious. He exhibits a depressed mood.     ED Course  Procedures (including critical care time)  Labs Reviewed  INFLUENZA PANEL BY PCR   No results found.   1. Sinusitis   2. Viral URI   3. SUBSTANCE ABUSE, MULTIPLE       MDM  Acute sinusitis. The patient has been provided with a prescription for Z-Pak, will check an influenza PCR and a CBC given the patient had a fever yesterday. If influenza PCR is positive we will prescribe Tamiflu. Patient has been advised to return to the ER if he has worsening symptoms of fever which is persistent  HIV continue  REYATAZ,ritonavir  Depression/anxiety/polysubstance abuse-patient has an upcoming appointment with the psychiatrist on 3/6 he will get his Adderall and his Xanax filled by him   Follow up in one month       Richarda Overlie, MD 03/29/12 1313

## 2012-03-30 ENCOUNTER — Ambulatory Visit (INDEPENDENT_AMBULATORY_CARE_PROVIDER_SITE_OTHER): Payer: Self-pay | Admitting: Infectious Diseases

## 2012-03-30 ENCOUNTER — Encounter: Payer: Self-pay | Admitting: Infectious Diseases

## 2012-03-30 VITALS — BP 150/77 | HR 66 | Temp 97.6°F | Ht 70.0 in | Wt 169.0 lb

## 2012-03-30 DIAGNOSIS — Z113 Encounter for screening for infections with a predominantly sexual mode of transmission: Secondary | ICD-10-CM

## 2012-03-30 DIAGNOSIS — F341 Dysthymic disorder: Secondary | ICD-10-CM

## 2012-03-30 DIAGNOSIS — Z79899 Other long term (current) drug therapy: Secondary | ICD-10-CM

## 2012-03-30 DIAGNOSIS — B20 Human immunodeficiency virus [HIV] disease: Secondary | ICD-10-CM

## 2012-03-30 LAB — COMPREHENSIVE METABOLIC PANEL
Albumin: 4.2 g/dL (ref 3.5–5.2)
Alkaline Phosphatase: 107 U/L (ref 39–117)
BUN: 13 mg/dL (ref 6–23)
CO2: 28 mEq/L (ref 19–32)
Glucose, Bld: 82 mg/dL (ref 70–99)
Sodium: 140 mEq/L (ref 135–145)
Total Bilirubin: 2.9 mg/dL — ABNORMAL HIGH (ref 0.3–1.2)
Total Protein: 6.6 g/dL (ref 6.0–8.3)

## 2012-03-30 LAB — CBC
HCT: 42.6 % (ref 39.0–52.0)
Hemoglobin: 14.8 g/dL (ref 13.0–17.0)
MCH: 33 pg (ref 26.0–34.0)
MCHC: 34.7 g/dL (ref 30.0–36.0)
RBC: 4.49 MIL/uL (ref 4.22–5.81)

## 2012-03-30 LAB — LIPID PANEL
Cholesterol: 149 mg/dL (ref 0–200)
Total CHOL/HDL Ratio: 3.5 Ratio
Triglycerides: 86 mg/dL (ref <150)
VLDL: 17 mg/dL (ref 0–40)

## 2012-03-30 LAB — RPR

## 2012-03-30 NOTE — Assessment & Plan Note (Signed)
Will have him seen by Bernette Redbird (tomorrow), get him psychiatry eval.

## 2012-03-30 NOTE — Progress Notes (Signed)
  Subjective:    Patient ID: Darrell Schwartz, male    DOB: 09-26-1970, 42 y.o.   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 Was on EFV/CBV but had difficulty with ADR from EFV. Also hx of urinary stricture, penile warts, anxiety depression.  Seen in ED yesterday, given z-pack for sinusitis.  Still having issues with depression. Has been off adderal/xanax since Feb 15th after having been d/c from [facility].   HIV 1 RNA Quant (copies/mL)  Date Value  10/27/2011 <20   04/07/2011 <20   10/09/2010 <20      CD4 T Cell Abs (cmm)  Date Value  10/27/2011 460   04/09/2011 350*  04/07/2011 310*   Separated from wife now.   Review of Systems     Objective:   Physical Exam  Constitutional: He appears well-developed and well-nourished.  HENT:  Mouth/Throat: No oropharyngeal exudate.  Eyes: EOM are normal. Pupils are equal, round, and reactive to light.  Neck: Neck supple.  Cardiovascular: Normal rate, regular rhythm and normal heart sounds.   Pulmonary/Chest: Effort normal and breath sounds normal.  Abdominal: Soft. Bowel sounds are normal. There is no tenderness. There is no rebound.  Lymphadenopathy:    He has no cervical adenopathy.  Psychiatric: He has a normal mood and affect.          Assessment & Plan:

## 2012-03-30 NOTE — Addendum Note (Signed)
Addended by: Mariea Clonts D on: 03/30/2012 11:57 AM   Modules accepted: Orders

## 2012-03-30 NOTE — Assessment & Plan Note (Signed)
He is taking his ART well. Will recheck his labs. Check his Hepatitis B S Ab and hepatitis A as well. See him back in 6 months.

## 2012-03-31 ENCOUNTER — Telehealth: Payer: Self-pay | Admitting: *Deleted

## 2012-03-31 ENCOUNTER — Ambulatory Visit: Payer: Self-pay

## 2012-03-31 LAB — HIV-1 RNA QUANT-NO REFLEX-BLD: HIV-1 RNA Quant, Log: <1.3 {Log} (ref <1.30)

## 2012-03-31 LAB — T-HELPER CELL (CD4) - (RCID CLINIC ONLY): CD4 T Cell Abs: 370 uL — ABNORMAL LOW (ref 400–2700)

## 2012-03-31 NOTE — Telephone Encounter (Signed)
Patient given information for the [facility] for psychiatry. They request that the patient call to schedule his own appt. [name] will not see him as their MD does not prescribe Xanax and patient is on this. Advised him to keep his appt with Franne Forts, counselor, for today as he may have some other ideas, as patient no longer has insurance. Darrell Schwartz

## 2012-04-13 ENCOUNTER — Other Ambulatory Visit: Payer: Self-pay

## 2012-04-21 ENCOUNTER — Encounter (HOSPITAL_COMMUNITY): Payer: Self-pay | Admitting: *Deleted

## 2012-04-21 ENCOUNTER — Emergency Department (INDEPENDENT_AMBULATORY_CARE_PROVIDER_SITE_OTHER)
Admission: EM | Admit: 2012-04-21 | Discharge: 2012-04-21 | Disposition: A | Discharge status: Home / Self Care | Payer: Self-pay | Source: Home / Self Care

## 2012-04-21 ENCOUNTER — Encounter: Payer: Self-pay | Admitting: Infectious Diseases

## 2012-04-21 DIAGNOSIS — F419 Anxiety disorder, unspecified: Secondary | ICD-10-CM

## 2012-04-21 DIAGNOSIS — F411 Generalized anxiety disorder: Secondary | ICD-10-CM

## 2012-04-21 DIAGNOSIS — J329 Chronic sinusitis, unspecified: Secondary | ICD-10-CM

## 2012-04-21 MED ORDER — OXYCODONE-ACETAMINOPHEN 7.5-325 MG PO TABS: 1.0000 | ORAL_TABLET | ORAL | Status: DC | PRN

## 2012-04-21 MED ORDER — ALPRAZOLAM 1 MG PO TABS: 1.0000 mg | ORAL_TABLET | Freq: Four times a day (QID) | ORAL | Status: DC | PRN

## 2012-04-21 MED ORDER — AMOXICILLIN 500 MG PO TABS: 500.0000 mg | ORAL_TABLET | Freq: Two times a day (BID) | ORAL | Status: DC

## 2012-04-21 NOTE — ED Provider Notes (Signed)
History     CSN: [id number]  Arrival date & time 04/21/12  0904   None     Chief Complaint  Patient presents with  . URI    (Consider location/radiation/quality/duration/timing/severity/associated sxs/prior treatment) HPI Patient is 42 year old male with history of HIV, presenting today for evaluation of sinus headaches, started 10 days prior to this visit, headache mostly in front of the face and around the nasal area, throbbing and constant, 10 out of 10 in severity, associated with generalized weakness, nasal congestion and rhinorrhea, fullness in the ears, poor oral intake, subjective fevers and chills. Patient reports similar events in the past and has history of sinus infections. He denies chest pain or shortness of breath, no cough, no visual changes, no stiff neck. Patient also denies changes in weight, no difficulty or pain with swallowing. Patient denies abdominal or urinary concerns  Past Medical History  Diagnosis Date  . HIV infection   . Anxiety     History reviewed. No pertinent past surgical history.  Family History  Problem Relation Age of Onset  . Heart disease Mother   . Heart disease Father     History  Substance Use Topics  . Smoking status: Current Every Day Smoker -- 0.50 packs/day for 17 years    Types: Cigarettes  . Smokeless tobacco: Never Used  . Alcohol Use: No     Comment: rarely     Review of Systems  Constitutional: Negative for diaphoresis, activity change, appetite change.  HENT: Negative for facial swelling,  neck pain, neck stiffness and ear discharge.   Eyes: Negative for pain, discharge, redness, itching and visual disturbance.  Respiratory: Negative for cough, choking, chest tightness, shortness of breath, wheezing and stridor.   Cardiovascular: Negative for chest pain, palpitations and leg swelling.  Gastrointestinal: Negative for abdominal distention.  Genitourinary: Negative for dysuria, urgency, frequency, hematuria, flank  pain, decreased urine volume, difficulty urinating and dyspareunia.  Musculoskeletal: Negative for back pain, joint swelling, arthralgias and gait problem.  Neurological: Negative for dizziness, tremors, seizures, syncope, facial asymmetry, speech difficulty, weakness, light-headedness, numbness and headaches.  Hematological: Negative for adenopathy. Does not bruise/bleed easily.  Psychiatric/Behavioral: Negative for hallucinations, behavioral problems, confusion, dysphoric mood, decreased concentration and agitation.    Allergies  Review of patient's allergies indicates no known allergies.  Home Medications   Current Outpatient Rx  Name  Route  Sig  Dispense  Refill  . albuterol (PROVENTIL HFA;VENTOLIN HFA) 108 (90 BASE) MCG/ACT inhaler   Inhalation   Inhale 2 puffs into the lungs every 6 (six) hours as needed for wheezing.   1 Inhaler   2   . ALPRAZolam (XANAX) 1 MG tablet   Oral   Take 1 tablet (1 mg total) by mouth 4 (four) times daily as needed.   120 tablet   1   . amoxicillin (AMOXIL) 500 MG tablet   Oral   Take 1 tablet (500 mg total) by mouth 2 (two) times daily.   14 tablet   0   . atazanavir (REYATAZ) 300 MG capsule   Oral   Take 1 capsule (300 mg total) by mouth daily.   30 capsule   11   . azithromycin (ZITHROMAX) 250 MG tablet      2 Po on day 1, then 1 po on days 2-5   6 each   0   . emtricitabine-tenofovir (TRUVADA) 200-300 MG per tablet   Oral   Take 1 tablet by mouth daily.   30 tablet  11   . oxyCODONE-acetaminophen (PERCOCET) 7.5-325 MG per tablet   Oral   Take 1 tablet by mouth every 4 (four) hours as needed for pain.   65 tablet   0   . ritonavir (NORVIR) 100 MG TABS   Oral   Take 1 tablet (100 mg total) by mouth daily with breakfast.   30 tablet   11     BP 127/77  Pulse 78  Temp(Src) 97.8 F (36.6 C) (Oral)  Resp 16  SpO2 98%  Physical Exam  Constitutional: Appears well-developed and well-nourished. No distress.  HENT:  Normocephalic. External right and left ear normal. Oropharynx is erythematous, left ear tympanic membranes swollen and erythematous, poorly visualized, tenderness with examination, frontal and maxillary bilateral sinus area tenderness Eyes: Conjunctivae and EOM are normal. PERRLA, no scleral icterus.  Neck: Normal ROM. Neck supple. No JVD. No tracheal deviation. No thyromegaly.  CVS: RRR, S1/S2 +, no murmurs, no gallops, no carotid bruit.  Pulmonary: Effort and breath sounds normal, no stridor, rhonchi, wheezes, rales.  Abdominal: Soft. BS +,  no distension, tenderness, rebound or guarding.  Musculoskeletal: Normal range of motion. No edema and no tenderness.  Lymphadenopathy: No lymphadenopathy noted, cervical, inguinal. Neuro: Alert. Normal reflexes, muscle tone coordination. No cranial nerve deficit. Skin: Skin is warm and dry. No rash noted. Not diaphoretic. No erythema. No pallor.  Psychiatric: Normal mood and affect. Behavior, judgment, thought content normal.    ED Course  Procedures (including critical care time)  Labs Reviewed - No data to display No results found.   1. Sinusitis - with possible left side ear infection, we'll treat with one week of antibiotic amoxicillin 500 mg twice a day. Patient advised to come back and see [place] sooner if the symptoms are not better or if he feels worse. Emphasized handwashing regularly and avoiding sick contacts or exposures.   2. Anxiety - stable on exam today, continue Xanax       MDM  Sinus infection with left ear infection-one week of amoxicillin 500 mg twice a day        Dorothea Ogle, MD 04/21/12 [phone]

## 2012-04-21 NOTE — ED Notes (Signed)
Patient complains of head congestion and drainage x 3 days; some chest congestion. Denies fever/chills.

## 2012-04-22 ENCOUNTER — Telehealth: Payer: Self-pay | Admitting: *Deleted

## 2012-04-22 NOTE — Telephone Encounter (Signed)
Patient called and advised he thinks he needs to speak to the doctor about his forms for the [employer]. He advised he does not think we understand why he needs them and he wants to make sure the doctor has a clear understanding. Advised him considering the confusion that may be the best thing. Dr Ninetta Lights has an available appt 05/02/12 at 4 pm and that works with the patient schedule.

## 2012-04-27 ENCOUNTER — Ambulatory Visit: Payer: Self-pay | Admitting: Infectious Diseases

## 2012-05-02 ENCOUNTER — Ambulatory Visit: Payer: Self-pay | Admitting: Infectious Diseases

## 2012-05-10 ENCOUNTER — Encounter (HOSPITAL_COMMUNITY): Payer: Self-pay | Admitting: *Deleted

## 2012-05-10 ENCOUNTER — Ambulatory Visit (INDEPENDENT_AMBULATORY_CARE_PROVIDER_SITE_OTHER): Payer: Medicare Other | Admitting: Infectious Diseases

## 2012-05-10 ENCOUNTER — Encounter: Payer: Self-pay | Admitting: Infectious Diseases

## 2012-05-10 ENCOUNTER — Emergency Department (INDEPENDENT_AMBULATORY_CARE_PROVIDER_SITE_OTHER)
Admission: EM | Admit: 2012-05-10 | Discharge: 2012-05-10 | Disposition: A | Discharge status: Home / Self Care | Payer: Medicare Other | Source: Home / Self Care

## 2012-05-10 VITALS — BP 133/81 | HR 75 | Temp 97.9°F | Wt 165.0 lb

## 2012-05-10 DIAGNOSIS — B20 Human immunodeficiency virus [HIV] disease: Secondary | ICD-10-CM

## 2012-05-10 DIAGNOSIS — S4992XA Unspecified injury of left shoulder and upper arm, initial encounter: Secondary | ICD-10-CM

## 2012-05-10 DIAGNOSIS — S4980XA Other specified injuries of shoulder and upper arm, unspecified arm, initial encounter: Secondary | ICD-10-CM

## 2012-05-10 DIAGNOSIS — F341 Dysthymic disorder: Secondary | ICD-10-CM

## 2012-05-10 DIAGNOSIS — F411 Generalized anxiety disorder: Secondary | ICD-10-CM

## 2012-05-10 DIAGNOSIS — F1910 Other psychoactive substance abuse, uncomplicated: Secondary | ICD-10-CM

## 2012-05-10 DIAGNOSIS — M545 Low back pain: Secondary | ICD-10-CM

## 2012-05-10 DIAGNOSIS — F419 Anxiety disorder, unspecified: Secondary | ICD-10-CM

## 2012-05-10 DIAGNOSIS — Z23 Encounter for immunization: Secondary | ICD-10-CM

## 2012-05-10 DIAGNOSIS — M129 Arthropathy, unspecified: Secondary | ICD-10-CM

## 2012-05-10 HISTORY — DX: Cerebral infarction, unspecified: I63.9

## 2012-05-10 MED ORDER — ALPRAZOLAM 1 MG PO TABS: 1.0000 mg | ORAL_TABLET | Freq: Four times a day (QID) | ORAL | Status: DC | PRN

## 2012-05-10 MED ORDER — OXYCODONE-ACETAMINOPHEN 7.5-325 MG PO TABS: 1.0000 | ORAL_TABLET | ORAL | Status: DC | PRN

## 2012-05-10 MED ORDER — AMPHETAMINE-DEXTROAMPHETAMINE 20 MG PO TABS: 20.0000 mg | ORAL_TABLET | Freq: Two times a day (BID) | ORAL | Status: DC

## 2012-05-10 NOTE — Assessment & Plan Note (Signed)
Will get him back in with psychiatry.

## 2012-05-10 NOTE — Assessment & Plan Note (Signed)
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

## 2012-05-10 NOTE — ED Notes (Signed)
Medication refills

## 2012-05-10 NOTE — ED Provider Notes (Signed)
History     CSN: [id number]  Arrival date & time 05/10/12  1009   First MD Initiated Contact with Patient 05/10/12 1027      No chief complaint on file.   (Consider location/radiation/quality/duration/timing/severity/associated sxs/prior treatment) HPI Patient is 42 year old male who presents to clinic for followup and needs refill on his meds. He denies chest pain or shortness of breath, no recent sicknesses or hospitalizations. 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 He explains that pain medicine is taking care of his pain.  Past Medical History  Diagnosis Date  . HIV infection   . Anxiety   . Stroke     History reviewed. No pertinent past surgical history.  Family History  Problem Relation Age of Onset  . Heart disease Mother   . Heart disease Father     History  Substance Use Topics  . Smoking status: Current Every Day Smoker -- 0.25 packs/day for 17 years    Types: Cigarettes  . Smokeless tobacco: Never Used     Comment: using electronic cigarettes  . Alcohol Use: No     Comment: rarely     Review of Systems  Constitutional: Negative for fever, chills, diaphoresis, activity change, appetite change and fatigue.  HENT: Negative for ear pain, nosebleeds, congestion, facial swelling, rhinorrhea, neck pain, neck stiffness and ear discharge.   Eyes: Negative for pain, discharge, redness, itching and visual disturbance.  Respiratory: Negative for cough, choking, chest tightness, shortness of breath, wheezing and stridor.   Cardiovascular: Negative for chest pain, palpitations and leg swelling.  Gastrointestinal: Negative for abdominal distention.  Genitourinary: Negative for dysuria, urgency, frequency, hematuria, flank pain, decreased urine volume, difficulty urinating and dyspareunia.  Musculoskeletal: Negative for back pain, joint swelling, arthralgias and gait problem.  Neurological: Negative for dizziness, tremors, seizures, syncope, facial  asymmetry, speech difficulty, weakness, light-headedness, numbness and headaches.  Hematological: Negative for adenopathy. Does not bruise/bleed easily.  Psychiatric/Behavioral: Negative for hallucinations, behavioral problems, confusion, dysphoric mood, decreased concentration and agitation.    Allergies  Review of patient's allergies indicates no known allergies.  Home Medications   Current Outpatient Rx  Name  Route  Sig  Dispense  Refill  . albuterol (PROVENTIL HFA;VENTOLIN HFA) 108 (90 BASE) MCG/ACT inhaler   Inhalation   Inhale 2 puffs into the lungs every 6 (six) hours as needed for wheezing.   1 Inhaler   2   . ALPRAZolam (XANAX) 1 MG tablet   Oral   Take 1 tablet (1 mg total) by mouth 4 (four) times daily as needed.   120 tablet   1   . amphetamine-dextroamphetamine (ADDERALL) 20 MG tablet   Oral   Take 1 tablet (20 mg total) by mouth 2 (two) times daily.   120 tablet   0   . atazanavir (REYATAZ) 300 MG capsule   Oral   Take 1 capsule (300 mg total) by mouth daily.   30 capsule   11   . emtricitabine-tenofovir (TRUVADA) 200-300 MG per tablet   Oral   Take 1 tablet by mouth daily.   30 tablet   11   . oxyCODONE-acetaminophen (PERCOCET) 7.5-325 MG per tablet   Oral   Take 1 tablet by mouth every 4 (four) hours as needed for pain.   65 tablet   0   . ritonavir (NORVIR) 100 MG TABS   Oral   Take 1 tablet (100 mg total) by mouth daily with breakfast.   30 tablet  11     BP 116/75  Pulse 75  Temp(Src) 97.8 F (36.6 C) (Oral)  Resp 18  SpO2 100%  Physical Exam  Constitutional: Appears well-developed and well-nourished. No distress.  HENT: Normocephalic. External right and left ear normal. Oropharynx is clear and moist.  Eyes: Conjunctivae and EOM are normal. PERRLA, no scleral icterus.  Neck: Normal ROM. Neck supple. No JVD. No tracheal deviation. No thyromegaly.  CVS: RRR, S1/S2 +, no murmurs, no gallops, no carotid bruit.  Pulmonary: Effort  and breath sounds normal, no stridor, rhonchi, wheezes, rales.  Abdominal: Soft. BS +,  no distension, tenderness, rebound or guarding.  Musculoskeletal: Normal range of motion. No edema and no tenderness.  Lymphadenopathy: No lymphadenopathy noted, cervical, inguinal. Neuro: Alert. Normal reflexes, muscle tone coordination. No cranial nerve deficit. Skin: Skin is warm and dry. No rash noted. Not diaphoretic. No erythema. No pallor.  Psychiatric: Normal mood and affect. Behavior, judgment, thought content normal.    ED Course  Procedures (including critical care time)  Labs Reviewed - No data to display No results found.   1. Injury of left shoulder - this is now stable, will continue analgesia as needed for symptom relief   2. HIV INFECTION - follows with infectious disease specialist, will continue to follow up on recommendations   3. Anxiety - stable, continue Xanax       MDM  Chronic pain, HIV infection, anxiety        Dorothea Ogle, MD 05/10/12 1043

## 2012-05-10 NOTE — Assessment & Plan Note (Signed)
Will try to get him into pain clinic.  

## 2012-05-10 NOTE — Addendum Note (Signed)
Addended by: Andree Coss on: 05/10/2012 10:34 AM   Modules accepted: Orders

## 2012-05-10 NOTE — Assessment & Plan Note (Addendum)
Taking art well, needs PNVx booster. Given condoms. Despite his other medical issues, he is doing outstanding. Will get him into dental. Will see him back in 6 months.

## 2012-05-10 NOTE — Progress Notes (Signed)
  Subjective:    Patient ID: Darrell Schwartz, male    DOB: 1970-10-30, 42 y.o.   MRN: [id number]  HPI 42 yo M with HIV+ dx June 2006, currently on ATVr/TRV. Was on EFV/CBV but had difficulty with ADR from EFV. Also hx of urinary stricture, penile warts, anxiety depression.  Seen in urgent care 3-27 and had refills of his percocet, xanax. Has not kept f/u appts with kenny.  previously d/c from [facility].  Is trying to get into new medical providers.  C/o continued abn dreams, pain in legs, arms, difficulty getting out of bed.   HIV 1 RNA Quant (copies/mL)  Date Value  03/30/2012 <20   10/27/2011 <20   04/07/2011 <20      CD4 T Cell Abs (cmm)  Date Value  03/30/2012 370*  10/27/2011 460   04/09/2011 350*     Review of Systems  Constitutional: Negative for appetite change and unexpected weight change.  Gastrointestinal: Positive for abdominal pain and constipation.  Musculoskeletal: Positive for myalgias and arthralgias.  Psychiatric/Behavioral: Positive for sleep disturbance.       Objective:   Physical Exam  Constitutional: He appears well-developed and well-nourished.  HENT:  Mouth/Throat: No oropharyngeal exudate.  Eyes: EOM are normal. Pupils are equal, round, and reactive to light.  Neck: Neck supple.  Cardiovascular: Normal rate, regular rhythm and normal heart sounds.   Pulmonary/Chest: Effort normal and breath sounds normal.  Abdominal: Soft. Bowel sounds are normal. There is tenderness. There is no rebound and no guarding.  Lymphadenopathy:    He has no cervical adenopathy.  Psychiatric:  Multiple comments about feeling like "world is against him".           Assessment & Plan:

## 2012-05-12 ENCOUNTER — Telehealth: Payer: Self-pay | Admitting: *Deleted

## 2012-05-12 NOTE — Telephone Encounter (Signed)
Referral made to [facility].  Per their operator, the patient would need to be seen in their walk-in clinic to begin services.  Patient given this information and verbalized understanding. Patient out of town today (in [place] with his family) but will attempt this afternoon when he returns, if not first thing tomorrow morning.  Patient made aware of the contact information for the office.  Patient eager to try, as he claims he was unable to be seen by psychiatry although he had an appointment and the next available appointment would be at the end of April. Andree Coss, RN

## 2012-06-14 ENCOUNTER — Ambulatory Visit: Payer: Medicare Other | Attending: Internal Medicine | Admitting: Internal Medicine

## 2012-06-14 VITALS — BP 127/80 | HR 87 | Temp 98.1°F | Resp 17 | Wt 164.2 lb

## 2012-06-14 DIAGNOSIS — M549 Dorsalgia, unspecified: Secondary | ICD-10-CM | POA: Insufficient documentation

## 2012-06-14 DIAGNOSIS — G894 Chronic pain syndrome: Secondary | ICD-10-CM

## 2012-06-14 DIAGNOSIS — B20 Human immunodeficiency virus [HIV] disease: Secondary | ICD-10-CM

## 2012-06-14 DIAGNOSIS — F329 Major depressive disorder, single episode, unspecified: Secondary | ICD-10-CM | POA: Insufficient documentation

## 2012-06-14 DIAGNOSIS — Z21 Asymptomatic human immunodeficiency virus [HIV] infection status: Secondary | ICD-10-CM | POA: Insufficient documentation

## 2012-06-14 DIAGNOSIS — F3289 Other specified depressive episodes: Secondary | ICD-10-CM | POA: Insufficient documentation

## 2012-06-14 MED ORDER — AMPHETAMINE-DEXTROAMPHETAMINE 20 MG PO TABS: 20.0000 mg | ORAL_TABLET | Freq: Two times a day (BID) | ORAL | Status: DC

## 2012-06-14 MED ORDER — OXYCODONE-ACETAMINOPHEN 7.5-325 MG PO TABS: 1.0000 | ORAL_TABLET | ORAL | Status: DC | PRN

## 2012-06-14 NOTE — Progress Notes (Signed)
Patient here for medication refill.

## 2012-06-14 NOTE — Progress Notes (Signed)
Patient ID: Darrell Schwartz, male   DOB: 07-02-1970, 42 y.o.   MRN: [id number] Patient Demographics  Darrell Schwartz, is a 42 y.o. male  [id number]  [id number]  DOB - 02/16/1970  Chief Complaint  Patient presents with  . Medication Refill        Subjective:   Darrell Schwartz today is here for a follow up visit.He has HIV and is on ART's, followed by Dr Ninetta Lights. 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 He has depression, and has seen a Psych MD recently, and is now on Welbutrin. Apart from his chronic pain, he has no acute issues.  Patient has No headache, No chest pain, No abdominal pain - No Nausea, No new weakness tingling or numbness, No Cough - SOB.   Objective:    Filed Vitals:   06/14/12 1742  BP: 127/80  SpO2: 98%     ALLERGIES:  No Known Allergies  PAST MEDICAL HISTORY: Past Medical History  Diagnosis Date  . HIV infection   . Anxiety   . Stroke     MEDICATIONS AT HOME: Prior to Admission medications   Medication Sig Start Date End Date Taking? Authorizing Provider  albuterol (PROVENTIL HFA;VENTOLIN HFA) 108 (90 BASE) MCG/ACT inhaler Inhale 2 puffs into the lungs every 6 (six) hours as needed for wheezing. 03/29/12   Richarda Overlie, MD  ALPRAZolam Prudy Feeler) 1 MG tablet Take 1 tablet (1 mg total) by mouth 4 (four) times daily as needed. 05/10/12   Dorothea Ogle, MD  amphetamine-dextroamphetamine (ADDERALL) 20 MG tablet Take 1 tablet (20 mg total) by mouth 2 (two) times daily. 06/14/12   Rashaud Ybarbo Levora Dredge, MD  atazanavir (REYATAZ) 300 MG capsule Take 1 capsule (300 mg total) by mouth daily. 03/07/12   Ginnie Smart, MD  emtricitabine-tenofovir (TRUVADA) 200-300 MG per tablet Take 1 tablet by mouth daily. 03/07/12   Ginnie Smart, MD  oxyCODONE-acetaminophen (PERCOCET) 7.5-325 MG per tablet Take 1 tablet by mouth every 4 (four) hours as needed for pain. 06/14/12   Dessie Tatem Levora Dredge, MD  ritonavir (NORVIR) 100 MG TABS Take 1 tablet (100 mg total) by mouth daily with breakfast. 03/07/12   Ginnie Smart, MD     Exam  General appearance :Awake, alert, not in any distress. Speech Clear. Not toxic Looking HEENT: Atraumatic and Normocephalic, pupils equally reactive to light and accomodation Neck: supple, no JVD. No cervical lymphadenopathy.  Chest:Good air entry bilaterally, no added sounds  CVS: S1 S2 regular, no murmurs.  Abdomen: Bowel sounds present, Non tender and not distended with no gaurding, rigidity or rebound. Extremities: B/L Lower Ext shows no edema, both legs are warm to touch Neurology: Awake alert, and oriented X 3, CN II-XII intact, Non focal Skin:No Rash Wounds:N/A    Data Review   CBC No results found for this basename: WBC, HGB, HCT, PLT, MCV, MCH, MCHC, RDW, NEUTRABS, LYMPHSABS, MONOABS, EOSABS, BASOSABS, BANDABS, BANDSABD,  in the last 168 hours  Chemistries   No results found for this basename: NA, K, CL, CO2, GLUCOSE, BUN, CREATININE, GFRCGP, CALCIUM, MG, AST, ALT, ALKPHOS, BILITOT,  in the last 168 hours ------------------------------------------------------------------------------------------------------------------ No results found for this basename: HGBA1C,  in the last 72 hours ------------------------------------------------------------------------------------------------------------------ No results found for this basename: CHOL, HDL, LDLCALC, TRIG, CHOLHDL, LDLDIRECT,  in the last 72 hours ------------------------------------------------------------------------------------------------------------------ No results found for this basename: TSH, T4TOTAL, FREET3, T3FREE, THYROIDAB,  in the  last 72 hours ------------------------------------------------------------------------------------------------------------------ No results found for this basename: VITAMINB12, FOLATE, FERRITIN, TIBC, IRON, RETICCTPCT,  in the last 72  hours  Coagulation profile  No results found for this basename: INR, PROTIME,  in the last 168 hours    Assessment & Plan   HIV -c/w ART's-direected by the ID clinic  Chronic Neuropathic Pain/Back pain -refilled percocet this visit -have asked the clinic staff to refer patient to a pain clinic  Depression -on welbutrin-see's psych for this issue as well.  Follow up in 1 month.

## 2012-06-22 ENCOUNTER — Telehealth: Payer: Self-pay | Admitting: *Deleted

## 2012-06-22 NOTE — Telephone Encounter (Signed)
Patients Rxs for knee, back, and wrist orthosis faxed to [phone], [facility]. Wendall Mola

## 2012-07-04 ENCOUNTER — Ambulatory Visit: Payer: Medicare Other | Attending: Family Medicine | Admitting: Internal Medicine

## 2012-07-04 ENCOUNTER — Telehealth: Payer: Self-pay | Admitting: General Practice

## 2012-07-04 VITALS — BP 127/88 | HR 82 | Temp 98.0°F | Resp 16 | Wt 163.6 lb

## 2012-07-04 DIAGNOSIS — F419 Anxiety disorder, unspecified: Secondary | ICD-10-CM

## 2012-07-04 DIAGNOSIS — F411 Generalized anxiety disorder: Secondary | ICD-10-CM

## 2012-07-04 DIAGNOSIS — M542 Cervicalgia: Secondary | ICD-10-CM | POA: Insufficient documentation

## 2012-07-04 MED ORDER — OXYCODONE-ACETAMINOPHEN 10-325 MG PO TABS: 1.0000 | ORAL_TABLET | Freq: Three times a day (TID) | ORAL | Status: DC | PRN

## 2012-07-04 MED ORDER — AMPHETAMINE-DEXTROAMPHETAMINE 20 MG PO TABS: 20.0000 mg | ORAL_TABLET | Freq: Two times a day (BID) | ORAL | Status: DC

## 2012-07-04 MED ORDER — ALPRAZOLAM 1 MG PO TABS: 1.0000 mg | ORAL_TABLET | Freq: Four times a day (QID) | ORAL | Status: DC | PRN

## 2012-07-04 NOTE — Progress Notes (Signed)
Patient here for neck pain States was only given 15 day supply of pain meds Waiting on pain management

## 2012-07-04 NOTE — Progress Notes (Signed)
Patient ID: Darrell Schwartz, male   DOB: Jan 08, 1971, 42 y.o.   MRN: [id number]   CC: Neck pain  HPI: Patient is 42 year old male who comes to clinic with main concern of 2 day history of acute onset neck pain, constant and 7/10 in severity, radiating down the right arm, no similar events in the past, no specific alleviating or aggravating factors, unable to rotate, flex or extend his neck. Patient denies fevers and chills, no headaches, no specific triggering factors that would cause this. He explains that he has been lifting heavier weights over the weekend and feels as if he has pulled a muscle.  No Known Allergies Past Medical History  Diagnosis Date  . HIV infection   . Anxiety   . Stroke    Current Outpatient Prescriptions on File Prior to Visit  Medication Sig Dispense Refill  . albuterol (PROVENTIL HFA;VENTOLIN HFA) 108 (90 BASE) MCG/ACT inhaler Inhale 2 puffs into the lungs every 6 (six) hours as needed for wheezing.  1 Inhaler  2  . atazanavir (REYATAZ) 300 MG capsule Take 1 capsule (300 mg total) by mouth daily.  30 capsule  11  . emtricitabine-tenofovir (TRUVADA) 200-300 MG per tablet Take 1 tablet by mouth daily.  30 tablet  11  . ritonavir (NORVIR) 100 MG TABS Take 1 tablet (100 mg total) by mouth daily with breakfast.  30 tablet  11   No current facility-administered medications on file prior to visit.   Family History  Problem Relation Age of Onset  . Heart disease Mother   . Heart disease Father    History   Social History  . Marital Status: Married    Spouse Name: N/A    Number of Children: N/A  . Years of Education: N/A   Occupational History  . Not on file.   Social History Main Topics  . Smoking status: Current Every Day Smoker -- 0.25 packs/day for 17 years    Types: Cigarettes  . Smokeless tobacco: Never Used     Comment: using electronic cigarettes  . Alcohol Use: No     Comment: rarely  . Drug Use: No  . Sexually Active: Yes     Comment: pt. given  condoms   Other Topics Concern  . Not on file   Social History Narrative  . No narrative on file    Review of Systems  Constitutional: Negative for fever, chills, diaphoresis, activity change, appetite change and fatigue.  HENT: Negative for ear pain, nosebleeds, congestion, facial swelling, rhinorrhea, neck pain, neck stiffness and ear discharge.   Eyes: Negative for pain, discharge, redness, itching and visual disturbance.  Respiratory: Negative for cough, choking, chest tightness, shortness of breath, wheezing and stridor.   Cardiovascular: Negative for chest pain, palpitations and leg swelling.  Gastrointestinal: Negative for abdominal distention.  Genitourinary: Negative for dysuria, urgency, frequency, hematuria, flank pain, decreased urine volume, difficulty urinating and dyspareunia.  Musculoskeletal: Negative for back pain, joint swelling, arthralgias and gait problem.  Neurological: Negative for dizziness, tremors, seizures, syncope, facial asymmetry, speech difficulty, weakness, light-headedness, numbness and headaches.  Hematological: Negative for adenopathy. Does not bruise/bleed easily.  Psychiatric/Behavioral: Negative for hallucinations, behavioral problems, confusion, dysphoric mood, decreased concentration and agitation.    Objective:   Filed Vitals:   07/04/12 1015  BP: 127/88  Pulse: 82  Temp: 98 F (36.7 C)  Resp: 16    Physical Exam  Constitutional: Appears well-developed and well-nourished. No distress.  HENT: Normocephalic. External right and left ear normal.  Oropharynx is clear and moist.  Eyes: Conjunctivae and EOM are normal. PERRLA, no scleral icterus.  Neck: Normal ROM. Neck supple. No JVD. No tracheal deviation. No thyromegaly.  CVS: RRR, S1/S2 +, no murmurs, no gallops, no carotid bruit.  Pulmonary: Effort and breath sounds normal, no stridor, rhonchi, wheezes, rales.  Abdominal: Soft. BS +,  no distension, tenderness, rebound or guarding.   Musculoskeletal: significant cervical paraspinal tenderness, unable to extend or flex neck, unable to rotate left and right  Lymphadenopathy: No lymphadenopathy noted, cervical, inguinal. Neuro: Alert. Normal reflexes, muscle tone coordination. No cranial nerve deficit. Skin: Skin is warm and dry. No rash noted. Not diaphoretic. No erythema. No pallor.  Psychiatric: Normal mood and affect. Behavior, judgment, thought content normal.   Lab Results  Component Value Date   WBC 5.8 03/30/2012   HGB 14.8 03/30/2012   HCT 42.6 03/30/2012   MCV 94.9 03/30/2012   PLT 157 03/30/2012   Lab Results  Component Value Date   CREATININE 0.92 03/30/2012   BUN 13 03/30/2012   NA 140 03/30/2012   K 4.1 03/30/2012   CL 105 03/30/2012   CO2 28 03/30/2012    No results found for this basename: HGBA1C   Lipid Panel     Component Value Date/Time   CHOL 149 03/30/2012 1154   TRIG 86 03/30/2012 1154   HDL 42 03/30/2012 1154   CHOLHDL 3.5 03/30/2012 1154   VLDL 17 03/30/2012 1154   LDLCALC 90 03/30/2012 1154       Assessment and plan:   Patient Active Problem List   Diagnosis Date Noted  .  neck pain  - consistent with cervicalgia, I have advised applying warm compresses to the area or heating pad whichever release pain, we have discussed this may last for 1-2 weeks, I have advised patient to come back if his symptoms do not get better.continue analgesia as needed for pain control  10/27/2011

## 2012-07-04 NOTE — Telephone Encounter (Signed)
Called [facility] and gave verbal for Xanax. Told pharmacy that pt will have to return to have Rx for Percocet signed.

## 2012-07-04 NOTE — Telephone Encounter (Signed)
Pt just here, paper script not signed by Dr. Izola Price. Pharm says can e-sign.   Thank you

## 2012-07-04 NOTE — Patient Instructions (Signed)

## 2012-07-04 NOTE — Telephone Encounter (Signed)
Patient

## 2012-07-04 NOTE — Telephone Encounter (Addendum)
Pain clinic referral? Has not heard back.

## 2012-07-06 NOTE — Telephone Encounter (Signed)
Can you please address these.Clydie Braun sent it to me. Thanks

## 2012-08-23 ENCOUNTER — Ambulatory Visit: Payer: Medicare Other | Attending: Family Medicine | Admitting: Internal Medicine

## 2012-08-23 VITALS — BP 131/74 | HR 105 | Temp 98.0°F | Resp 17 | Wt 160.2 lb

## 2012-08-23 DIAGNOSIS — F419 Anxiety disorder, unspecified: Secondary | ICD-10-CM

## 2012-08-23 DIAGNOSIS — K089 Disorder of teeth and supporting structures, unspecified: Secondary | ICD-10-CM

## 2012-08-23 DIAGNOSIS — F411 Generalized anxiety disorder: Secondary | ICD-10-CM

## 2012-08-23 MED ORDER — ALPRAZOLAM 1 MG PO TABS: 1.0000 mg | ORAL_TABLET | Freq: Four times a day (QID) | ORAL | Status: AC | PRN

## 2012-08-23 MED ORDER — OXYCODONE-ACETAMINOPHEN 10-325 MG PO TABS: 1.0000 | ORAL_TABLET | Freq: Three times a day (TID) | ORAL | Status: DC | PRN

## 2012-08-23 MED ORDER — AMOXICILLIN-POT CLAVULANATE 875-125 MG PO TABS: 1.0000 | ORAL_TABLET | Freq: Two times a day (BID) | ORAL | Status: DC

## 2012-08-23 NOTE — Progress Notes (Signed)
Patient here for medication refill  Needs dental referral

## 2012-08-23 NOTE — Progress Notes (Signed)
Patient ID: Darrell Schwartz, male   DOB: August 19, 1970, 42 y.o.   MRN: [id number]  Patient Demographics  Darrell Schwartz, is a 42 y.o. male  WUJ:[id number]  [id number]  DOB - [birth year]  Chief Complaint  Patient presents with  . Medication Refill        Subjective:   [name] with History of chronic pain, HIV, ADD, substance abuse in the past,  is here for getting his pain medications and anxiety medications refilled, he says he missed his appointment with the psychiatrist due to some scheduling error, he was not seen in the pain clinic as he was not eligible. He also claims that he had a tooth come out from his right upper molar area and is here to get a dentist referred.  Denies any subjective complaints except as above, no active headache, no chest abdominal pain at this time, not short of breath. No focal weakness which is new.   Objective:    Patient Active Problem List   Diagnosis Date Noted  . Sinusitis 10/27/2011  . Insomnia 04/22/2011  . Tobacco abuse 06/09/2010  . Injury of left shoulder 06/09/2010  . Viral URI 05/28/2010  . CONSTIPATION 02/18/2010  . 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 HIV INFECTION 02/14/2009  . CLOSED FRACTURE OF UNSPECIFIED PART OF ULNA 02/14/2009  . THRUSH 01/31/2009  . PNEUMOCYSTIS PNEUMONIA 01/31/2009  . PENILE LESION 01/31/2009  . UNSPECIFIED THROMBOCYTOPENIA 01/31/2009  . ANXIETY DEPRESSION 01/31/2009  . SUBSTANCE ABUSE, MULTIPLE 01/31/2009  . ARTHRITIS 01/31/2009  . LOW BACK PAIN, CHRONIC 01/31/2009     Filed Vitals:   08/23/12 1337  BP: 131/74  Pulse: 105  Temp: 98 F (36.7 C)  Resp: 17  Weight: 160 lb 3.2 oz (72.666 kg)  SpO2: 100%     Exam  Disgruntled white male who speaking loudly and an aggressive manner, appears to be in no distress whatsoever. Awake Alert, Oriented X 3, No new F.N deficits, Normal affect, right upper Mardene Speak has a cavity, appears dark and half evuled but this appears  old. [place].AT,PERRAL Supple Neck,No JVD, No cervical lymphadenopathy appriciated.  Symmetrical Chest wall movement, Good air movement bilaterally, CTAB RRR,No Gallops,Rubs or new Murmurs, No Parasternal Heave +ve B.Sounds, Abd Soft, Non tender, No organomegaly appriciated, No rebound - guarding or rigidity. No Cyanosis, Clubbing or edema, No new Rash or bruise       Data Review   CBC No results found for this basename: WBC, HGB, HCT, PLT, MCV, MCH, MCHC, RDW, NEUTRABS, LYMPHSABS, MONOABS, EOSABS, BASOSABS, BANDABS, BANDSABD,  in the last 168 hours  Chemistries   No results found for this basename: NA, K, CL, CO2, GLUCOSE, BUN, CREATININE, GFRCGP, CALCIUM, MG, AST, ALT, ALKPHOS, BILITOT,  in the last 168 hours ------------------------------------------------------------------------------------------------------------------ No results found for this basename: HGBA1C,  in the last 72 hours ------------------------------------------------------------------------------------------------------------------ No results found for this basename: CHOL, HDL, LDLCALC, TRIG, CHOLHDL, LDLDIRECT,  in the last 72 hours ------------------------------------------------------------------------------------------------------------------ No results found for this basename: TSH, T4TOTAL, FREET3, T3FREE, THYROIDAB,  in the last 72 hours ------------------------------------------------------------------------------------------------------------------ No results found for this basename: VITAMINB12, FOLATE, FERRITIN, TIBC, IRON, RETICCTPCT,  in the last 72 hours  Coagulation profile  No results found for this basename: INR, PROTIME,  in the last 168 hours     Prior to Admission medications   Medication Sig Start Date End Date Taking? Authorizing Provider  albuterol (PROVENTIL HFA;VENTOLIN HFA) 108 (90 BASE) MCG/ACT inhaler Inhale 2 puffs into the lungs every  6 (six) hours as needed for wheezing. 03/29/12   Richarda Overlie, MD  ALPRAZolam Prudy Feeler) 1 MG tablet Take 1 tablet (1 mg total) by mouth 4 (four) times daily as needed. 08/23/12   Leroy Sea, MD  amphetamine-dextroamphetamine (ADDERALL) 20 MG tablet Take 1 tablet (20 mg total) by mouth 2 (two) times daily. 07/04/12   Dorothea Ogle, MD  atazanavir (REYATAZ) 300 MG capsule Take 1 capsule (300 mg total) by mouth daily. 03/07/12   Ginnie Smart, MD  emtricitabine-tenofovir (TRUVADA) 200-300 MG per tablet Take 1 tablet by mouth daily. 03/07/12   Ginnie Smart, MD  oxyCODONE-acetaminophen (PERCOCET) 10-325 MG per tablet Take 1 tablet by mouth every 8 (eight) hours as needed for pain. 08/23/12   Leroy Sea, MD  ritonavir (NORVIR) 100 MG TABS Take 1 tablet (100 mg total) by mouth daily with breakfast. 03/07/12   Ginnie Smart, MD     Assessment & Plan   History of toothache. Patient shows a tooth evulsion which appears old, have referred him to dentist, will give him prescription for Augmentin for 7 days. Doubt this is new or there is infection.   History of HIV follows with the ID clinic here.    History of ADD, bipolar. Referred to psychiatrist. Has been told that future prescriptions of Xanax and Adderall should come from the psychiatrist and not [place].   History of chronic pain. Patient requesting more narcotic refills. Will obtain a urine drug screen, we'll give him 10 day supply of current narcotics, referred to pain clinic made again. He claims he is been taking narcotics to this morning.      Immunizations tetanus shot next visit, clinic is out of tetanus shot       Leroy Sea M.D on 08/23/2012 at 1:48 PM

## 2012-08-25 ENCOUNTER — Telehealth: Payer: Self-pay

## 2012-08-25 LAB — DRUG SCREEN, URINE
Amphetamines: POSITIVE ng/mL
Barbiturates: NEGATIVE ng/mL
Benzodiazepines: POSITIVE ng/mL

## 2012-08-25 NOTE — Telephone Encounter (Signed)
LVM for patient to call back. ?

## 2012-08-25 NOTE — Telephone Encounter (Signed)
Message copied by Lestine Mount on Thu Aug 25, 2012 12:05 PM ------      Message from: [facility]      Created: Thu Aug 25, 2012 10:44 AM       Please inform the patient that he may be seen at a different clinic hours in the future, he should find a different primary care physician. His urine drug screen was negative for opioids. He had claimed he had taken opioids during the day of clinic visit. Also he is positive for cannabis. ------

## 2012-08-25 NOTE — Progress Notes (Signed)
Quick Note:  Please inform the patient that he may be seen at a different clinic hours in the future, he should find a different primary care physician. His urine drug screen was negative for opioids. He had claimed he had taken opioids during the day of clinic visit. Also he is positive for cannabis. ______

## 2012-08-26 ENCOUNTER — Telehealth: Payer: Self-pay | Admitting: Family Medicine

## 2012-08-26 NOTE — Telephone Encounter (Signed)
Pt returning call and has question about med refill.

## 2012-08-30 ENCOUNTER — Telehealth: Payer: Self-pay | Admitting: *Deleted

## 2012-08-30 NOTE — Telephone Encounter (Signed)
08/30/12 Attempt to reach patient regarding questions but no answer. P.Cambrie Sonnenfeld,RN BSN [facility]

## 2012-09-06 ENCOUNTER — Ambulatory Visit: Payer: Medicare Other

## 2012-09-15 ENCOUNTER — Ambulatory Visit: Payer: Medicare Other | Attending: Family Medicine | Admitting: Internal Medicine

## 2012-09-15 ENCOUNTER — Telehealth: Payer: Self-pay | Admitting: Family Medicine

## 2012-09-15 NOTE — Progress Notes (Unsigned)
Patient ID: Darrell Schwartz, male   DOB: [birth year], 42 y.o.   MRN: [id number]  CC:  HPI: 42 year old male with a history of HIV, ADD, , substance abuse, who presents to the clinic for a refill of his Adderall, Percocet. The patient has an upcoming appointment with the pain clinic and with her psychiatrist in September The patient claims that the only pain medication he has been taking is the Percocet He was provided with a refill on 7/29 by Dr. Thedore Mins, the patient has exhausted his 30 tablets. He now wants me to refill them His UDS was positive for amphetamines, benzodiazepines, cannabinoids      No Known Allergies Past Medical History  Diagnosis Date  . HIV infection   . Anxiety   . Stroke    Current Outpatient Prescriptions on File Prior to Visit  Medication Sig Dispense Refill  . albuterol (PROVENTIL HFA;VENTOLIN HFA) 108 (90 BASE) MCG/ACT inhaler Inhale 2 puffs into the lungs every 6 (six) hours as needed for wheezing.  1 Inhaler  2  . ALPRAZolam (XANAX) 1 MG tablet Take 1 tablet (1 mg total) by mouth 4 (four) times daily as needed.  30 tablet  1  . amoxicillin-clavulanate (AUGMENTIN) 875-125 MG per tablet Take 1 tablet by mouth 2 (two) times daily.  14 tablet  0  . amphetamine-dextroamphetamine (ADDERALL) 20 MG tablet Take 1 tablet (20 mg total) by mouth 2 (two) times daily.  60 tablet  0  . atazanavir (REYATAZ) 300 MG capsule Take 1 capsule (300 mg total) by mouth daily.  30 capsule  11  . emtricitabine-tenofovir (TRUVADA) 200-300 MG per tablet Take 1 tablet by mouth daily.  30 tablet  11  . oxyCODONE-acetaminophen (PERCOCET) 10-325 MG per tablet Take 1 tablet by mouth every 8 (eight) hours as needed for pain.  30 tablet  0  . ritonavir (NORVIR) 100 MG TABS Take 1 tablet (100 mg total) by mouth daily with breakfast.  30 tablet  11   No current facility-administered medications on file prior to visit.   Family History  Problem Relation Age of Onset  . Heart disease Mother   .  Heart disease Father    History   Social History  . Marital Status: Married    Spouse Name: N/A    Number of Children: N/A  . Years of Education: N/A   Occupational History  . Not on file.   Social History Main Topics  . Smoking status: Current Every Day Smoker -- 0.25 packs/day for 17 years    Types: Cigarettes  . Smokeless tobacco: Never Used     Comment: using electronic cigarettes  . Alcohol Use: No     Comment: rarely  . Drug Use: No  . Sexual Activity: Yes     Comment: pt. given condoms   Other Topics Concern  . Not on file   Social History Narrative  . No narrative on file    Review of Systems  Constitutional: Negative for fever, chills, diaphoresis, activity change, appetite change and fatigue.  HENT: Negative for ear pain, nosebleeds, congestion, facial swelling, rhinorrhea, neck pain, neck stiffness and ear discharge.   Eyes: Negative for pain, discharge, redness, itching and visual disturbance.  Respiratory: Negative for cough, choking, chest tightness, shortness of breath, wheezing and stridor.   Cardiovascular: Negative for chest pain, palpitations and leg swelling.  Gastrointestinal: Negative for abdominal distention.  Genitourinary: Negative for dysuria, urgency, frequency, hematuria, flank pain, decreased urine volume, difficulty urinating and dyspareunia.  Musculoskeletal: Negative for back pain, joint swelling, arthralgias and gait problem.  Neurological: Negative for dizziness, tremors, seizures, syncope, facial asymmetry, speech difficulty, weakness, light-headedness, numbness and headaches.  Hematological: Negative for adenopathy. Does not bruise/bleed easily.  Psychiatric/Behavioral: Negative for hallucinations, behavioral problems, confusion, dysphoric mood, decreased concentration and agitation.    Objective:   Filed Vitals:   09/15/12 1548  BP: 144/84  Pulse: 100  Temp: 98 F (36.7 C)  Resp: 17    Physical Exam  Constitutional: Appears  well-developed and well-nourished. No distress.  HENT: Normocephalic. External right and left ear normal. Oropharynx is clear and moist.  Eyes: Conjunctivae and EOM are normal. PERRLA, no scleral icterus.  Neck: Normal ROM. Neck supple. No JVD. No tracheal deviation. No thyromegaly.  CVS: RRR, S1/S2 +, no murmurs, no gallops, no carotid bruit.  Pulmonary: Effort and breath sounds normal, no stridor, rhonchi, wheezes, rales.  Abdominal: Soft. BS +,  no distension, tenderness, rebound or guarding.  Musculoskeletal: Normal range of motion. No edema and no tenderness.  Lymphadenopathy: No lymphadenopathy noted, cervical, inguinal. Neuro: Alert. Normal reflexes, muscle tone coordination. No cranial nerve deficit. Skin: Skin is warm and dry. No rash noted. Not diaphoretic. No erythema. No pallor.  Psychiatric: Normal mood and affect. Behavior, judgment, thought content normal.   Lab Results  Component Value Date   WBC 5.8 03/30/2012   HGB 14.8 03/30/2012   HCT 42.6 03/30/2012   MCV 94.9 03/30/2012   PLT 157 03/30/2012   Lab Results  Component Value Date   CREATININE 0.92 03/30/2012   BUN 13 03/30/2012   NA 140 03/30/2012   K 4.1 03/30/2012   CL 105 03/30/2012   CO2 28 03/30/2012    No results found for this basename: HGBA1C   Lipid Panel     Component Value Date/Time   CHOL 149 03/30/2012 1154   TRIG 86 03/30/2012 1154   HDL 42 03/30/2012 1154   CHOLHDL 3.5 03/30/2012 1154   VLDL 17 03/30/2012 1154   LDLCALC 90 03/30/2012 1154       Assessment and plan:   Patient Active Problem List   Diagnosis Date Noted  . Sinusitis 10/27/2011  . Insomnia 04/22/2011  . Tobacco abuse 06/09/2010  . Injury of left shoulder 06/09/2010  . Viral URI 05/28/2010  . CONSTIPATION 02/18/2010  . 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 HIV INFECTION 02/14/2009  . CLOSED FRACTURE OF UNSPECIFIED PART OF ULNA 02/14/2009  . THRUSH 01/31/2009  . PNEUMOCYSTIS PNEUMONIA 01/31/2009  . PENILE LESION 01/31/2009  .  UNSPECIFIED THROMBOCYTOPENIA 01/31/2009  . ANXIETY DEPRESSION 01/31/2009  . SUBSTANCE ABUSE, MULTIPLE 01/31/2009  . ARTHRITIS 01/31/2009  . LOW BACK PAIN, CHRONIC 01/31/2009   Chronic pain Patient does have an upcoming appointment with the pain clinic in September I have declined to fill his prescriptions because the patient has a UDS positive for cannabinoid I confronted him with the fact that the only way you can have a positive UDS for cannabinoids if you have been smoking marijuana Patient adamantly denies this and claims that it is [name] medications that is causing a positive cannabinoid screen  I discussed it with Dr. Algis Liming, and none of [name] medications are contributing  into his positive cannabinoid screen The only HIV medication that does this, is atripla, and patient is not on this medication  Print production planner notified and patient will be given alternative locations to visit for his primary care needs for the next 30  days

## 2012-09-15 NOTE — Progress Notes (Unsigned)
Patient here for medication refill.

## 2012-09-16 ENCOUNTER — Telehealth: Payer: Self-pay | Admitting: *Deleted

## 2012-09-16 NOTE — Telephone Encounter (Signed)
Patient came to clinic 09/15/12 and was upset because the [facility] will not fill his pain medication. He advised he tested positive for Marijuana and he does not smoke. He says it is because he took sustiva by mistake and wants [place] to give him a Rx. Advised patient that we only treat him for his 042 and we sent him to the pain clinic for his pain issues (appt 09/2012) therefore he will have to wait until that appt to get meds. He advised his therapist will give him Adderral he also [facility] as [facility] never told him he was not getting his medication refilled and he is sick and in pain and does not want to get out of the bed in the morning. Advised him to call or go there to get clinic supervisor info.

## 2012-09-27 ENCOUNTER — Other Ambulatory Visit (INDEPENDENT_AMBULATORY_CARE_PROVIDER_SITE_OTHER): Payer: Medicare Other

## 2012-09-27 ENCOUNTER — Other Ambulatory Visit (HOSPITAL_COMMUNITY)
Admission: RE | Admit: 2012-09-27 | Discharge: 2012-09-27 | Disposition: A | Discharge status: Home / Self Care | Payer: Medicare Other | Source: Ambulatory Visit | Attending: Internal Medicine | Admitting: Internal Medicine

## 2012-09-27 DIAGNOSIS — B20 Human immunodeficiency virus [HIV] disease: Secondary | ICD-10-CM

## 2012-09-27 DIAGNOSIS — Z113 Encounter for screening for infections with a predominantly sexual mode of transmission: Secondary | ICD-10-CM

## 2012-09-27 LAB — COMPREHENSIVE METABOLIC PANEL
ALT: 18 U/L (ref 0–53)
AST: 22 U/L (ref 0–37)
Creat: 0.88 mg/dL (ref 0.50–1.35)
Total Bilirubin: 3.1 mg/dL — ABNORMAL HIGH (ref 0.3–1.2)

## 2012-09-27 LAB — CBC
HCT: 40.8 % (ref 39.0–52.0)
Hemoglobin: 14 g/dL (ref 13.0–17.0)
RBC: 4.27 MIL/uL (ref 4.22–5.81)
WBC: 5.5 10*3/uL (ref 4.0–10.5)

## 2012-09-29 LAB — T-HELPER CELL (CD4) - (RCID CLINIC ONLY): CD4 T Cell Abs: 520 /uL (ref 400–2700)

## 2012-09-30 LAB — HIV-1 RNA QUANT-NO REFLEX-BLD
HIV 1 RNA Quant: <20 copies/mL (ref <20)
HIV-1 RNA Quant, Log: <1.3 {Log} (ref <1.30)

## 2012-10-03 ENCOUNTER — Ambulatory Visit: Payer: Medicare Other

## 2012-10-12 ENCOUNTER — Ambulatory Visit: Payer: Self-pay | Admitting: Infectious Diseases

## 2012-10-17 ENCOUNTER — Encounter: Payer: Self-pay | Admitting: Infectious Disease

## 2012-10-17 ENCOUNTER — Ambulatory Visit (INDEPENDENT_AMBULATORY_CARE_PROVIDER_SITE_OTHER): Payer: Medicare Other | Admitting: Infectious Disease

## 2012-10-17 VITALS — BP 119/78 | HR 64 | Temp 98.4°F | Wt 162.5 lb

## 2012-10-17 DIAGNOSIS — R21 Rash and other nonspecific skin eruption: Secondary | ICD-10-CM

## 2012-10-17 DIAGNOSIS — Z23 Encounter for immunization: Secondary | ICD-10-CM

## 2012-10-17 DIAGNOSIS — B20 Human immunodeficiency virus [HIV] disease: Secondary | ICD-10-CM

## 2012-10-17 DIAGNOSIS — F4323 Adjustment disorder with mixed anxiety and depressed mood: Secondary | ICD-10-CM

## 2012-10-17 DIAGNOSIS — E785 Hyperlipidemia, unspecified: Secondary | ICD-10-CM

## 2012-10-17 DIAGNOSIS — Z113 Encounter for screening for infections with a predominantly sexual mode of transmission: Secondary | ICD-10-CM

## 2012-10-17 NOTE — Progress Notes (Signed)
Subjective:    Patient ID: Darrell Schwartz, male    DOB: Aug 14, 1970, 42 y.o.   MRN: [id number]  Rash Pertinent negatives include no congestion, cough, diarrhea, fatigue, fever, rhinorrhea, shortness of breath, sore throat or vomiting.    Lenville Hibberd is a 42 y.o. male with HIV infection who is doing superbly well on his antiviral regimen, with undetectable viral load and health cd4 count.   He comes in today with complaints of a rash and his hand. He sure this is not poison ivy because in the past he has had more violent reactions to poison ivy he is concerned it might be a fungal infection. I told her that certainly could be or it could be a reaction to some other allergen. He will try some topical triamcinolone that he has at home for this. Otherwise he is doing quite well and is having no problems is current regimen of Reyataz  Norvir and Truvada.  He is very happy with [facility] where he is getting psychiatry and counseling.   Review of Systems  Constitutional: Negative for fever, chills, diaphoresis, activity change, appetite change, fatigue and unexpected weight change.  HENT: Negative for congestion, sore throat, rhinorrhea, sneezing, trouble swallowing and sinus pressure.   Eyes: Negative for photophobia and visual disturbance.  Respiratory: Negative for cough, chest tightness, shortness of breath, wheezing and stridor.   Cardiovascular: Negative for chest pain, palpitations and leg swelling.  Gastrointestinal: Negative for nausea, vomiting, abdominal pain, diarrhea, constipation, blood in stool, abdominal distention and anal bleeding.  Genitourinary: Negative for dysuria, hematuria, flank pain and difficulty urinating.  Musculoskeletal: Negative for myalgias, back pain, joint swelling, arthralgias and gait problem.  Skin: Positive for rash. Negative for color change, pallor and wound.  Neurological: Negative for dizziness, tremors, weakness and light-headedness.    Hematological: Negative for adenopathy. Does not bruise/bleed easily.  Psychiatric/Behavioral: Negative for behavioral problems, confusion, sleep disturbance, dysphoric mood, decreased concentration and agitation.       Objective:   Physical Exam  Constitutional: He is oriented to person, place, and time. He appears well-developed and well-nourished. No distress.  HENT:  Head: Normocephalic and atraumatic.  Mouth/Throat: Oropharynx is clear and moist. No oropharyngeal exudate.  Eyes: Conjunctivae and EOM are normal. Pupils are equal, round, and reactive to light. No scleral icterus.  Neck: Normal range of motion. Neck supple. No JVD present.  Cardiovascular: Normal rate, regular rhythm and normal heart sounds.  Exam reveals no gallop and no friction rub.   No murmur heard. Pulmonary/Chest: Effort normal and breath sounds normal. No respiratory distress. He has no wheezes. He has no rales. He exhibits no tenderness.  Abdominal: He exhibits no distension and no mass. There is no tenderness. There is no rebound and no guarding.  Musculoskeletal: He exhibits no edema and no tenderness.  Lymphadenopathy:    He has no cervical adenopathy.  Neurological: He is alert and oriented to person, place, and time. He has normal reflexes. He exhibits normal muscle tone. Coordination normal.  Skin: Skin is warm and dry. He is not diaphoretic. No erythema. No pallor.     Psychiatric: He has a normal mood and affect. His behavior is normal. Judgment and thought content normal.          Assessment & Plan:  HIV:  Continue current regimen.  Rash: Used topical triamcinolone. If this fails then try topical antifungal I would be willing to give him fluconazole.  Anxiety, Depression: followed by [facility]  that he highly recommends  HCM: Flu shot

## 2012-10-24 ENCOUNTER — Telehealth: Payer: Self-pay | Admitting: *Deleted

## 2012-10-24 NOTE — Telephone Encounter (Signed)
Pt requesting a referral for reported hearing loss and migraines x 2 months.  Pt has not mentioned this in previous appointments.  Pt informed we cannot make the referral without a doctor visit.  Pt given appointment for tomorrow with Dr. Lennox Laity, Zachary George, RN

## 2012-10-25 ENCOUNTER — Ambulatory Visit: Payer: Medicare Other | Admitting: Infectious Disease

## 2012-11-07 ENCOUNTER — Ambulatory Visit: Payer: Medicare Other | Admitting: Infectious Diseases

## 2012-11-14 ENCOUNTER — Telehealth: Payer: Self-pay | Admitting: *Deleted

## 2012-11-14 NOTE — Telephone Encounter (Signed)
Patient called stating he has good days and bad due to [name] and would like a letter stating this for community service. Told him this would be for the MD to decide as his labs are quite good. Darrell Schwartz

## 2012-11-15 NOTE — Telephone Encounter (Signed)
I am confused about what the pt exactly wishes me to day on his behalf? What is the obligation for community service?

## 2012-11-22 ENCOUNTER — Telehealth: Payer: Self-pay | Admitting: *Deleted

## 2012-11-22 ENCOUNTER — Ambulatory Visit: Payer: Medicare Other | Admitting: *Deleted

## 2012-11-22 DIAGNOSIS — F1910 Other psychoactive substance abuse, uncomplicated: Secondary | ICD-10-CM

## 2012-11-22 DIAGNOSIS — F4323 Adjustment disorder with mixed anxiety and depressed mood: Secondary | ICD-10-CM

## 2012-11-22 DIAGNOSIS — F341 Dysthymic disorder: Secondary | ICD-10-CM

## 2012-11-22 NOTE — Telephone Encounter (Signed)
Patient called with questions about a positive UDS (per pt, + for THC, amphetamines, and benzos). Patient states he took niacin from [facility] to cleanse his system, still had a positive result.  RN confirmed that the medications we are prescribing should not give a false positive for THC (no atripla, no marinol), explained that he is taking adderall and xanax.  Patient states he is a loss as to why he keeps coming up positive.  RN gave him an appointment with Max Menius, substance abuse counselor, for 3:00.  Pt agreed.

## 2012-11-22 NOTE — Progress Notes (Signed)
Patient ID: Virginia Francisco, male   DOB: 1970/06/30, 42 y.o.   MRN: [id number] Met with Onalee Hua at request of medical staff to eval for SA. He reports +thc UA result with probation officer - but denies recent usage of marijuana. He alludes to using drugs many years ago but denies that he is a current user or abuser of drugs. Jerrold explained that he is experiencing legal problems and potential probation revocation due to not completing community service and receiving the +thc drug test with PO. He reports also having pending court date on a drug charge stemming from activities that his sister engaged in at his residence but that he reports having no knowledge of. Pt has no interest in drug counseling presently, and denies an active problem. He wants to complete a blood test at [facility] today to demonstrate no presence of illicit substances in his system, but consultation with [facility] staff indicates there is no medical justification for providing a blood test. Consequently, patient is referred to local private lab for testing. He accepts this referral. Based on patient disclosure today and stated preferences, there is no rationale to proceed with setting up formal [facility] SA counseling. He is receiving psyc care at [facility] and states that if [facility] require him to participate in drug counseling, he can obtain that though [facility]. As patient is denying current use or an SA problem, no further meetings will be scheduled at this time. He agrees to keep [facility] staff informed of his developing legal situation. Pt's sole focus today is just obtaining a clean drug test in order to somehow halt potential probation revocation.  Zenya Hickam, [facility]

## 2012-11-23 NOTE — Telephone Encounter (Signed)
AGreed, his adderal and benzo would make his uDS pos for amphet and benzos. I am sure he is smoking marijuana and I dont have as a big a problem with that durg by itself. But he does have his of pSA

## 2012-12-29 ENCOUNTER — Telehealth: Payer: Self-pay | Admitting: *Deleted

## 2012-12-29 NOTE — Telephone Encounter (Signed)
Pharmacy called informing us that they are unable to contact patient for delivery of this month's medications.  RN attempted his phone numbers, there was no answer, unable to leave a message. Andree Coss, RN

## 2013-01-30 ENCOUNTER — Emergency Department (INDEPENDENT_AMBULATORY_CARE_PROVIDER_SITE_OTHER)
Admission: EM | Admit: 2013-01-30 | Discharge: 2013-01-30 | Disposition: A | Discharge status: Home / Self Care | Payer: Medicare Other | Source: Home / Self Care | Attending: Family Medicine | Admitting: Family Medicine

## 2013-01-30 ENCOUNTER — Encounter: Payer: Self-pay | Admitting: Emergency Medicine

## 2013-01-30 DIAGNOSIS — K089 Disorder of teeth and supporting structures, unspecified: Secondary | ICD-10-CM

## 2013-01-30 DIAGNOSIS — J111 Influenza due to unidentified influenza virus with other respiratory manifestations: Secondary | ICD-10-CM

## 2013-01-30 DIAGNOSIS — H6992 Unspecified Eustachian tube disorder, left ear: Secondary | ICD-10-CM

## 2013-01-30 DIAGNOSIS — M26629 Arthralgia of temporomandibular joint, unspecified side: Secondary | ICD-10-CM

## 2013-01-30 DIAGNOSIS — R69 Illness, unspecified: Secondary | ICD-10-CM

## 2013-01-30 DIAGNOSIS — H6990 Unspecified Eustachian tube disorder, unspecified ear: Secondary | ICD-10-CM

## 2013-01-30 DIAGNOSIS — K0889 Other specified disorders of teeth and supporting structures: Secondary | ICD-10-CM

## 2013-01-30 MED ORDER — KETOCONAZOLE 2 % EX CREA: TOPICAL_CREAM | CUTANEOUS | Status: DC

## 2013-01-30 MED ORDER — OSELTAMIVIR PHOSPHATE 75 MG PO CAPS: 75.0000 mg | ORAL_CAPSULE | Freq: Two times a day (BID) | ORAL | Status: DC

## 2013-01-30 MED ORDER — CLINDAMYCIN HCL 150 MG PO CAPS
ORAL_CAPSULE | ORAL | Status: DC
Start: 2013-01-30 — End: 2013-02-06

## 2013-01-30 NOTE — Discharge Instructions (Signed)
Take Mucinex D (1200mg  guaifenesin with decongestant) twice daily for congestion.  Increase fluid intake, rest. May use Afrin nasal spray (or generic oxymetazoline) twice daily for about 5 days.  Also recommend using saline nasal spray several times daily and saline nasal irrigation (AYR is a common brand) Apply ice pack to left TMJ several times daily.  Avoid chewy food and chewing gum. Stop all antihistamines for now, and other non-prescription cough/cold preparations. Follow-up with dentist within one week.

## 2013-01-30 NOTE — ED Notes (Signed)
Pt c/o LT ear pain and body aches x last night. He reports a hx of breaking a tooth on the LT side x 2 mths ago, he is sch'ed to have it removed when he is feeling better. Denies fever.

## 2013-01-30 NOTE — ED Provider Notes (Signed)
CSN: [id number]     Arrival date & time 01/30/13  3 History   First MD Initiated Contact with Patient 01/30/13 1713     Chief Complaint  Patient presents with  . Otalgia  . Generalized Body Aches  . Dental Pain      HPI Comments: Patient presents with several complaints" 1)  For about 1.5 months he has had a persistent left earache and decreased hearing. 2)  He complains of one day history of flu like symptoms with cough, myalgias, headache, chest tightness, and pressure in anterior chest. 3)  He has a 2 month history of persistent toothache in left jaw; he had been prescribed Keflex for 10 days with minimal improvement.  He is awaiting improvement before visiting a dentist. 4)  He complains of persistent non-pruritic rash on his left hand.  He had tried some left-over triamcinolone cream with improvement, but is concerned that it may be a ringworm infection.  The history is provided by the patient.    Past Medical History  Diagnosis Date  . HIV infection   . Anxiety   . Stroke   . Allergy   . Hypertension    History reviewed. No pertinent past surgical history. Family History  Problem Relation Age of Onset  . Heart disease Mother   . Heart disease Father    History  Substance Use Topics  . Smoking status: Current Every Day Smoker -- 0.25 packs/day for 17 years    Types: Cigarettes  . Smokeless tobacco: Never Used     Comment: using electronic cigarettes  . Alcohol Use: 1.0 oz/week    2 drink(s) per week     Comment: rarely    Review of Systems  Constitutional: Positive for chills. Negative for fever and unexpected weight change.  HENT: Positive for congestion, dental problem, ear pain, facial swelling, hearing loss and sinus pressure. Negative for ear discharge, mouth sores, nosebleeds and sore throat.   Eyes: Negative.   Respiratory: Positive for cough and chest tightness. Negative for shortness of breath and wheezing.   Cardiovascular: Negative.     Gastrointestinal: Negative.   Genitourinary: Negative.   Musculoskeletal: Negative.   Skin: Positive for rash.  Neurological: Positive for headaches.    Allergies  Review of patient's allergies indicates no known allergies.  Home Medications   Current Outpatient Rx  Name  Route  Sig  Dispense  Refill  . albuterol (PROVENTIL HFA;VENTOLIN HFA) 108 (90 BASE) MCG/ACT inhaler   Inhalation   Inhale 2 puffs into the lungs every 6 (six) hours as needed for wheezing.   1 Inhaler   2   . ALPRAZolam (XANAX) 1 MG tablet   Oral   Take 1 tablet (1 mg total) by mouth 4 (four) times daily as needed.   30 tablet   1   . amphetamine-dextroamphetamine (ADDERALL) 20 MG tablet   Oral   Take 1 tablet (20 mg total) by mouth 2 (two) times daily.   60 tablet   0   . atazanavir (REYATAZ) 300 MG capsule   Oral   Take 1 capsule (300 mg total) by mouth daily.   30 capsule   11   . clindamycin (CLEOCIN) 150 MG capsule      Take one po Q6hr   28 capsule   0   . emtricitabine-tenofovir (TRUVADA) 200-300 MG per tablet   Oral   Take 1 tablet by mouth daily.   30 tablet   11   . oseltamivir (  TAMIFLU) 75 MG capsule   Oral   Take 1 capsule (75 mg total) by mouth every 12 (twelve) hours.   10 capsule   0   . oxyCODONE-acetaminophen (PERCOCET) 10-325 MG per tablet   Oral   Take 1 tablet by mouth every 8 (eight) hours as needed for pain.   30 tablet   0   . ritonavir (NORVIR) 100 MG TABS   Oral   Take 1 tablet (100 mg total) by mouth daily with breakfast.   30 tablet   11    BP 129/78  Pulse 73  Temp(Src) 98.3 F (36.8 C) (Oral)  Resp 16  Ht 5' 9.5" (1.765 m)  Wt 173 lb (78.472 kg)  BMI 25.19 kg/m2  SpO2 99% Physical Exam  HENT:  Mouth/Throat: Uvula is midline. No oral lesions. No trismus in the jaw. Abnormal dentition. Dental caries present.    Indicated tooth left mandible is partly eroded and tender to tap.  There is tenderness of the surrounding gingival without  swelling.  No swelling of face noted.   Nursing notes and Vital Signs reviewed. Appearance:  Patient appears healthy, stated age, and in no acute distress Eyes:  Pupils are equal, round, and reactive to light and accomodation.  Extraocular movement is intact.  Conjunctivae are not inflamed  Ears:  Canals normal.  Tympanic membranes normal.  There is distinct tenderness over the left temporomandibular joint.  Palpation there recreates her pain.  Nose:  Mildly congested turbinates.  No sinus tenderness.  Maxillary sinus tenderness is present.  Pharynx:  Normal Neck:  Supple.  Slightly tender shotty anterior/posterior nodes are palpated bilaterally  Lungs:  Clear to auscultation.  Breath sounds are equal.  Heart:  Regular rate and rhythm without murmurs, rubs, or gallops.  Abdomen:  Nontender without masses or hepatosplenomegaly.  Bowel sounds are present.  No CVA or flank tenderness.  Extremities:  No edema.  No calf tenderness Skin:  Right hand:  On the dorsum and web space between first and second fingers is a non-specific scaly eruption with slight erythema.  No tenderness or swelling.   ED Course  Procedures  nne Labs Reviewed - Tympanogram:  Negative peak pressure left ear; normal right ear. Imaging Review     MDM   1. TMJ arthralgia   2. Eustachian tube disorder, left   3. Influenza-like illness   4. Toothache         ? Tinea manis right hand  Begin Tamiflu.  Begin Clindamycin to cover dental infection.  Take Mucinex D (1200mg  guaifenesin with decongestant) twice daily for congestion.  Increase fluid intake, rest. May use Afrin nasal spray (or generic oxymetazoline) twice daily for about 5 days.  Also recommend using saline nasal spray several times daily and saline nasal irrigation (AYR is a common brand) Apply ice pack to left TMJ several times daily.  Avoid chewy food and chewing gum. Stop all antihistamines for now, and other non-prescription cough/cold preparations. Begin  Nizoral cream to left hand Follow-up with dentist within one week.     Kandra Nicolas, MD 02/01/13 [phone]

## 2013-02-06 ENCOUNTER — Encounter: Payer: Self-pay | Admitting: Emergency Medicine

## 2013-02-06 ENCOUNTER — Emergency Department (INDEPENDENT_AMBULATORY_CARE_PROVIDER_SITE_OTHER): Payer: Medicare Other

## 2013-02-06 ENCOUNTER — Emergency Department (INDEPENDENT_AMBULATORY_CARE_PROVIDER_SITE_OTHER)
Admission: EM | Admit: 2013-02-06 | Discharge: 2013-02-06 | Disposition: A | Discharge status: Home / Self Care | Payer: Medicare Other | Source: Home / Self Care | Attending: Family Medicine | Admitting: Family Medicine

## 2013-02-06 DIAGNOSIS — J069 Acute upper respiratory infection, unspecified: Secondary | ICD-10-CM

## 2013-02-06 DIAGNOSIS — R05 Cough: Secondary | ICD-10-CM

## 2013-02-06 DIAGNOSIS — Z21 Asymptomatic human immunodeficiency virus [HIV] infection status: Secondary | ICD-10-CM

## 2013-02-06 DIAGNOSIS — R059 Cough, unspecified: Secondary | ICD-10-CM

## 2013-02-06 LAB — POCT INFLUENZA A/B
Influenza A, POC: NEGATIVE
Influenza B, POC: NEGATIVE

## 2013-02-06 MED ORDER — AZITHROMYCIN 250 MG PO TABS: ORAL_TABLET | ORAL | Status: DC

## 2013-02-06 NOTE — Discharge Instructions (Signed)
Take plain Mucinex (1200 mg guaifenesin) twice daily for cough and congestion.  May add Sudafed for sinus congestion.   Increase fluid intake, rest. May use Afrin nasal spray (or generic oxymetazoline) twice daily for about 5 days.  Also recommend using saline nasal spray several times daily and saline nasal irrigation (AYR is a common brand) Try warm salt water gargles for sore throat.  Stop all antihistamines for now, and other non-prescription cough/cold preparations. Stop Clindamycin and begin Azithromycin Follow-up with family doctor in about one week. Follow-up with dentist in about 4 days.Marland Kitchen

## 2013-02-06 NOTE — ED Provider Notes (Signed)
CSN: [id number]     Arrival date & time 02/06/13  1557 History   First MD Initiated Contact with Patient 02/06/13 1649     Chief Complaint  Patient presents with  . Generalized Body Aches  . Cough      HPI Comments: Patient reports that his previous flu-like illness resolved after taking Tamiflu.  He also notes that his tooth pain in left mandible has resolved while taking Clindamycin. Yesterday he developed myalgias, fever, non-productive cough, anterior chest tightness, fatigue, headache, sinus congestion, and soreness in his neck.  He vomited once at 3am today.  The history is provided by the patient.    Past Medical History  Diagnosis Date  . HIV infection   . Anxiety   . Stroke   . Allergy   . Hypertension    History reviewed. No pertinent past surgical history. Family History  Problem Relation Age of Onset  . Heart disease Mother   . Heart disease Father    History  Substance Use Topics  . Smoking status: Current Every Day Smoker -- 0.25 packs/day for 17 years    Types: Cigarettes  . Smokeless tobacco: Never Used     Comment: using electronic cigarettes  . Alcohol Use: 1.0 oz/week    2 drink(s) per week     Comment: rarely    Review of Systems No sore throat, but has soreness in neck + cough No pleuritic pain, but has tightness in anterior chest No wheezing + nasal congestion + post-nasal drainage No sinus pain/pressure No itchy/red eyes No earache No hemoptysis No SOB + fever, + chills + nausea + vomiting, resolved No abdominal pain No diarrhea No urinary symptoms No skin rash + fatigue + myalgias + headache    Allergies  Review of patient's allergies indicates no known allergies.  Home Medications   Current Outpatient Rx  Name  Route  Sig  Dispense  Refill  . albuterol (PROVENTIL HFA;VENTOLIN HFA) 108 (90 BASE) MCG/ACT inhaler   Inhalation   Inhale 2 puffs into the lungs every 6 (six) hours as needed for wheezing.   1 Inhaler   2   .  ALPRAZolam (XANAX) 1 MG tablet   Oral   Take 1 tablet (1 mg total) by mouth 4 (four) times daily as needed.   30 tablet   1   . amphetamine-dextroamphetamine (ADDERALL) 20 MG tablet   Oral   Take 1 tablet (20 mg total) by mouth 2 (two) times daily.   60 tablet   0   . atazanavir (REYATAZ) 300 MG capsule   Oral   Take 1 capsule (300 mg total) by mouth daily.   30 capsule   11   . azithromycin (ZITHROMAX Z-PAK) 250 MG tablet      Take 2 tabs today; then begin one tab once daily for 4 more days.   6 each   0   . emtricitabine-tenofovir (TRUVADA) 200-300 MG per tablet   Oral   Take 1 tablet by mouth daily.   30 tablet   11   . ketoconazole (NIZORAL) 2 % cream      Apply thin layer to affected area once daily until improved   30 g   0   . oxyCODONE-acetaminophen (PERCOCET) 10-325 MG per tablet   Oral   Take 1 tablet by mouth every 8 (eight) hours as needed for pain.   30 tablet   0   . ritonavir (NORVIR) 100 MG TABS   Oral  Take 1 tablet (100 mg total) by mouth daily with breakfast.   30 tablet   11    BP 111/74  Pulse 83  Temp(Src) 98.2 F (36.8 C) (Oral)  Resp 18  Wt 172 lb 12.8 oz (78.382 kg)  SpO2 99% Physical Exam Nursing notes and Vital Signs reviewed. Appearance:  Patient appears healthy, stated age, and in no acute distress Eyes:  Pupils are equal, round, and reactive to light and accomodation.  Extraocular movement is intact.  Conjunctivae are not inflamed  Ears:  Canals normal.  Tympanic membranes normal.  Nose:  Mildly congested turbinates.  No sinus tenderness.  Mouth:  No tooth tenderness  Pharynx:  Normal Neck:  Supple.  Tender shotty posterior nodes are palpated bilaterally  Lungs:  Clear to auscultation.  Breath sounds are equal.  Heart:  Regular rate and rhythm without murmurs, rubs, or gallops.  Abdomen:  Nontender without masses or hepatosplenomegaly.  Bowel sounds are present.  No CVA or flank tenderness.  Extremities:  No edema.  No  calf tenderness Skin:  No rash present.   ED Course  Procedures  None    Labs Reviewed  POCT INFLUENZA A/B negative   Imaging Review Dg Chest 2 View  02/06/2013   CLINICAL DATA:  Recurrent cough.  History of HIV  EXAM: CHEST  2 VIEW  COMPARISON:  [facility] CARDIAC PHARM REST/STRESS dated 10/25/2009; DG CHEST PORTABLE dated 10/10/2009  FINDINGS: Normal cardiac silhouette. No effusion, infiltrate, or pneumothorax. Prominent nipple shadows noted no aggressive osseous lesion.  IMPRESSION: No acute cardiopulmonary process.  No change from prior.   Electronically Signed   By: Suzy Bouchard M.D.   On: 02/06/2013 17:30      MDM   1. Acute upper respiratory infections of unspecified site; suspect new viral URI    Rx written for Z-pack. Take plain Mucinex (1200 mg guaifenesin) twice daily for cough and congestion.  May add Sudafed for sinus congestion.   Increase fluid intake, rest. May use Afrin nasal spray (or generic oxymetazoline) twice daily for about 5 days.  Also recommend using saline nasal spray several times daily and saline nasal irrigation (AYR is a common brand) Try warm salt water gargles for sore throat.  Stop all antihistamines for now, and other non-prescription cough/cold preparations. Stop Clindamycin and begin Azithromycin Follow-up with family doctor in about one week. Follow-up with dentist in about 4 days.Kandra Nicolas, MD 02/07/13 [phone]

## 2013-02-06 NOTE — ED Notes (Addendum)
Darrell Schwartz c/o fever, productive cough, body aches and HA x last night. He was seen here 1 week ago for "influenza like illness", reports symptoms resolved, and returned last night.

## 2013-03-06 ENCOUNTER — Ambulatory Visit: Payer: Medicare Other

## 2013-03-15 ENCOUNTER — Other Ambulatory Visit: Payer: Self-pay | Admitting: *Deleted

## 2013-03-15 DIAGNOSIS — B20 Human immunodeficiency virus [HIV] disease: Secondary | ICD-10-CM

## 2013-03-15 MED ORDER — RITONAVIR 100 MG PO TABS: 100.0000 mg | ORAL_TABLET | Freq: Every day | ORAL | Status: DC

## 2013-03-15 MED ORDER — ATAZANAVIR SULFATE 300 MG PO CAPS: 300.0000 mg | ORAL_CAPSULE | Freq: Every day | ORAL | Status: DC

## 2013-03-15 MED ORDER — EMTRICITABINE-TENOFOVIR DF 200-300 MG PO TABS: 1.0000 | ORAL_TABLET | Freq: Every day | ORAL | Status: DC

## 2013-03-16 ENCOUNTER — Encounter: Payer: Self-pay | Admitting: *Deleted

## 2013-03-16 NOTE — Telephone Encounter (Signed)
Error

## 2013-04-03 ENCOUNTER — Other Ambulatory Visit: Payer: Medicare Other

## 2013-04-03 DIAGNOSIS — Z113 Encounter for screening for infections with a predominantly sexual mode of transmission: Secondary | ICD-10-CM

## 2013-04-03 DIAGNOSIS — E785 Hyperlipidemia, unspecified: Secondary | ICD-10-CM

## 2013-04-03 DIAGNOSIS — B20 Human immunodeficiency virus [HIV] disease: Secondary | ICD-10-CM

## 2013-04-03 LAB — CBC WITH DIFFERENTIAL/PLATELET
Basophils Absolute: 0 10*3/uL (ref 0.0–0.1)
Basophils Relative: 0 % (ref 0–1)
EOS ABS: 0.1 10*3/uL (ref 0.0–0.7)
EOS PCT: 1 % (ref 0–5)
HCT: 42.1 % (ref 39.0–52.0)
Hemoglobin: 14.8 g/dL (ref 13.0–17.0)
LYMPHS ABS: 1 10*3/uL (ref 0.7–4.0)
Lymphocytes Relative: 18 % (ref 12–46)
MCH: 33.5 pg (ref 26.0–34.0)
MCHC: 35.2 g/dL (ref 30.0–36.0)
MCV: 95.2 fL (ref 78.0–100.0)
Monocytes Absolute: 0.3 10*3/uL (ref 0.1–1.0)
Monocytes Relative: 6 % (ref 3–12)
Neutro Abs: 4.4 10*3/uL (ref 1.7–7.7)
Neutrophils Relative %: 75 % (ref 43–77)
PLATELETS: 150 10*3/uL (ref 150–400)
RBC: 4.42 MIL/uL (ref 4.22–5.81)
RDW: 13 % (ref 11.5–15.5)
WBC: 5.8 10*3/uL (ref 4.0–10.5)

## 2013-04-03 LAB — LIPID PANEL
CHOLESTEROL: 165 mg/dL (ref 0–200)
HDL: 43 mg/dL (ref >39)
LDL Cholesterol: 105 mg/dL — ABNORMAL HIGH (ref 0–99)
TRIGLYCERIDES: 84 mg/dL (ref <150)
Total CHOL/HDL Ratio: 3.8 Ratio
VLDL: 17 mg/dL (ref 0–40)

## 2013-04-03 LAB — COMPLETE METABOLIC PANEL WITH GFR
ALT: 21 U/L (ref 0–53)
AST: 22 U/L (ref 0–37)
Albumin: 4.2 g/dL (ref 3.5–5.2)
Alkaline Phosphatase: 103 U/L (ref 39–117)
BILIRUBIN TOTAL: 1.7 mg/dL — AB (ref 0.2–1.2)
BUN: 11 mg/dL (ref 6–23)
CO2: 27 mEq/L (ref 19–32)
CREATININE: 0.83 mg/dL (ref 0.50–1.35)
Calcium: 8.6 mg/dL (ref 8.4–10.5)
Chloride: 106 mEq/L (ref 96–112)
GLUCOSE: 91 mg/dL (ref 70–99)
Potassium: 4.3 mEq/L (ref 3.5–5.3)
Sodium: 140 mEq/L (ref 135–145)
Total Protein: 6.7 g/dL (ref 6.0–8.3)

## 2013-04-04 ENCOUNTER — Encounter: Payer: Self-pay | Admitting: *Deleted

## 2013-04-04 ENCOUNTER — Telehealth: Payer: Self-pay | Admitting: *Deleted

## 2013-04-04 LAB — T-HELPER CELL (CD4) - (RCID CLINIC ONLY)
CD4 T CELL HELPER: 27 % — AB (ref 33–55)
CD4 T Cell Abs: 320 /uL — ABNORMAL LOW (ref 400–2700)

## 2013-04-04 LAB — HIV-1 RNA QUANT-NO REFLEX-BLD
HIV 1 RNA Quant: <20 copies/mL (ref <20)
HIV-1 RNA Quant, Log: <1.3 {Log} (ref <1.30)

## 2013-04-04 LAB — RPR

## 2013-04-04 NOTE — Telephone Encounter (Signed)
Patient called requesting that a note be faxed to his lawyer stating he has an appointment with Dr. Tommy Medal for tomorrow. Note stating this faxed to Daylene Posey at [phone]. Myrtis Hopping

## 2013-04-05 ENCOUNTER — Ambulatory Visit: Payer: Medicare Other | Admitting: Infectious Disease

## 2013-04-07 ENCOUNTER — Ambulatory Visit: Payer: Medicare Other

## 2013-04-07 ENCOUNTER — Other Ambulatory Visit: Payer: Self-pay | Admitting: *Deleted

## 2013-04-07 ENCOUNTER — Encounter: Payer: Self-pay | Admitting: *Deleted

## 2013-04-07 DIAGNOSIS — B20 Human immunodeficiency virus [HIV] disease: Secondary | ICD-10-CM

## 2013-04-07 MED ORDER — EMTRICITABINE-TENOFOVIR DF 200-300 MG PO TABS: 1.0000 | ORAL_TABLET | Freq: Every day | ORAL | Status: DC

## 2013-04-07 MED ORDER — ATAZANAVIR SULFATE 300 MG PO CAPS: 300.0000 mg | ORAL_CAPSULE | Freq: Every day | ORAL | Status: DC

## 2013-04-07 MED ORDER — RITONAVIR 100 MG PO TABS: 100.0000 mg | ORAL_TABLET | Freq: Every day | ORAL | Status: DC

## 2013-04-10 ENCOUNTER — Other Ambulatory Visit: Payer: Medicare Other

## 2013-04-24 ENCOUNTER — Ambulatory Visit (INDEPENDENT_AMBULATORY_CARE_PROVIDER_SITE_OTHER): Payer: Medicare Other | Admitting: Infectious Disease

## 2013-04-24 ENCOUNTER — Encounter: Payer: Self-pay | Admitting: Infectious Disease

## 2013-04-24 VITALS — BP 126/87 | HR 82 | Temp 98.5°F | Ht 69.0 in | Wt 185.0 lb

## 2013-04-24 DIAGNOSIS — Z113 Encounter for screening for infections with a predominantly sexual mode of transmission: Secondary | ICD-10-CM

## 2013-04-24 DIAGNOSIS — F411 Generalized anxiety disorder: Secondary | ICD-10-CM

## 2013-04-24 DIAGNOSIS — B20 Human immunodeficiency virus [HIV] disease: Secondary | ICD-10-CM

## 2013-04-24 DIAGNOSIS — E785 Hyperlipidemia, unspecified: Secondary | ICD-10-CM

## 2013-04-24 MED ORDER — EMTRICITABINE-TENOFOVIR DF 200-300 MG PO TABS: 1.0000 | ORAL_TABLET | Freq: Every day | ORAL | Status: DC

## 2013-04-24 MED ORDER — ATAZANAVIR-COBICISTAT 300-150 MG PO TABS: 1.0000 | ORAL_TABLET | Freq: Every day | ORAL | Status: DC

## 2013-04-24 NOTE — Progress Notes (Signed)
  Subjective:    Patient ID: Darrell Schwartz, male    DOB: [birth year], 43 y.o.   MRN: [id number]  HPI  Darrell Schwartz is a 43 y.o. male with HIV infection who is doing superbly well on his antiviral regimen, with undetectable viral load and health cd4 count.   He states that in past had been on Combivir and Sustiva which he could not tolerate due to"nightmares."  He had been initially cared for in [place], [place].    Review of Systems  Constitutional: Negative for chills, diaphoresis, activity change, appetite change and unexpected weight change.  HENT: Negative for sinus pressure, sneezing and trouble swallowing.   Eyes: Negative for photophobia and visual disturbance.  Respiratory: Negative for chest tightness, wheezing and stridor.   Cardiovascular: Negative for chest pain, palpitations and leg swelling.  Gastrointestinal: Negative for nausea, abdominal pain, constipation, blood in stool, abdominal distention and anal bleeding.  Genitourinary: Negative for dysuria, hematuria, flank pain and difficulty urinating.  Musculoskeletal: Negative for arthralgias, back pain, gait problem, joint swelling and myalgias.  Skin: Negative for color change, pallor and wound.  Neurological: Negative for dizziness, tremors, weakness and light-headedness.  Hematological: Negative for adenopathy. Does not bruise/bleed easily.  Psychiatric/Behavioral: Negative for behavioral problems, confusion, sleep disturbance, dysphoric mood, decreased concentration and agitation.       Objective:   Physical Exam  Nursing note and vitals reviewed. Constitutional: He is oriented to person, place, and time. He appears well-developed and well-nourished. No distress.  HENT:  Head: Normocephalic and atraumatic.  Mouth/Throat: Oropharynx is clear and moist. No oropharyngeal exudate.  Eyes: Conjunctivae and EOM are normal. No scleral icterus.  Neck: Normal range of motion. Neck supple. No JVD present.  Cardiovascular:  Normal rate, regular rhythm and normal heart sounds.   Pulmonary/Chest: Effort normal. No respiratory distress. He has no wheezes.  Abdominal: He exhibits no distension. There is no tenderness.  Musculoskeletal: He exhibits no edema and no tenderness.  Lymphadenopathy:    He has no cervical adenopathy.  Neurological: He is alert and oriented to person, place, and time. He exhibits normal muscle tone. Coordination normal.  Skin: Skin is warm and dry. He is not diaphoretic. No erythema. No pallor.  Psychiatric: He has a normal mood and affect. His behavior is normal. Judgment and thought content normal.          Assessment & Plan:  HIV:  Simplify to EVOTAZ and TRuvada and consider [employer] if Medicare will pay for this to consider ? He has any hidden mutations before we would ever consider changing to non PI regimen  I spent greater than 25 minutes with the patient including greater than 50% of time in face to face counsel of the patient and in coordination of their care.   Anxiety, Depression: followed by [facility] that he highly recommends

## 2013-04-24 NOTE — Patient Instructions (Signed)
YOUR NEW REGIMEN IS:  EVOTAZ ONE TABLET DAILY WITH  TRUVADA  YOU CAN BRING [place] ANY LEFT OVER REYATAZ OR NORVIR  PLEASE COME BACK TO SEE DR. VAN DAM IN July (NO LABS NECESSARY AT THAT VISIT) TO MEET WITH TISH AND RENEW ADAP  I WOULD LIKE TO DO A MONOGRAM BIOSCIENCES GENOSURE ARCHIVE TEST BUT WANT TO MAKE SURE YOUR INSURANCE COVERS THIS TEST

## 2013-05-10 ENCOUNTER — Other Ambulatory Visit: Payer: Self-pay | Admitting: Licensed Clinical Social Worker

## 2013-05-22 ENCOUNTER — Telehealth: Payer: Self-pay | Admitting: *Deleted

## 2013-05-22 NOTE — Telephone Encounter (Signed)
Received message from patient, stating that his medication Evotaz had been denied, needs a prior authorization.  RN contacted pharmacy, they stated there was no PA required, that the medication had been authorized, filled, and sent.  RN contacted patient, he received medication over the weekend, reports no problems with it.  Per his wife, the patient is quite happy with the new medication, feels he is able to sleep better now. Landis Gandy, RN

## 2013-08-02 ENCOUNTER — Ambulatory Visit: Payer: Medicare Other

## 2013-08-02 ENCOUNTER — Other Ambulatory Visit: Payer: Medicare Other

## 2013-08-02 DIAGNOSIS — Z113 Encounter for screening for infections with a predominantly sexual mode of transmission: Secondary | ICD-10-CM

## 2013-08-02 DIAGNOSIS — B20 Human immunodeficiency virus [HIV] disease: Secondary | ICD-10-CM

## 2013-08-02 DIAGNOSIS — E785 Hyperlipidemia, unspecified: Secondary | ICD-10-CM

## 2013-08-02 LAB — CBC WITH DIFFERENTIAL/PLATELET
Basophils Absolute: 0 10*3/uL (ref 0.0–0.1)
Basophils Relative: 0 % (ref 0–1)
EOS ABS: 0.1 10*3/uL (ref 0.0–0.7)
EOS PCT: 2 % (ref 0–5)
HCT: 38.7 % — ABNORMAL LOW (ref 39.0–52.0)
Hemoglobin: 13.4 g/dL (ref 13.0–17.0)
Lymphocytes Relative: 27 % (ref 12–46)
Lymphs Abs: 1.6 10*3/uL (ref 0.7–4.0)
MCH: 32.6 pg (ref 26.0–34.0)
MCHC: 34.6 g/dL (ref 30.0–36.0)
MCV: 94.2 fL (ref 78.0–100.0)
Monocytes Absolute: 0.4 10*3/uL (ref 0.1–1.0)
Monocytes Relative: 7 % (ref 3–12)
Neutro Abs: 3.8 10*3/uL (ref 1.7–7.7)
Neutrophils Relative %: 64 % (ref 43–77)
PLATELETS: 153 10*3/uL (ref 150–400)
RBC: 4.11 MIL/uL — ABNORMAL LOW (ref 4.22–5.81)
RDW: 13 % (ref 11.5–15.5)
WBC: 6 10*3/uL (ref 4.0–10.5)

## 2013-08-02 LAB — COMPLETE METABOLIC PANEL WITH GFR
ALT: 17 U/L (ref 0–53)
AST: 23 U/L (ref 0–37)
Albumin: 4 g/dL (ref 3.5–5.2)
Alkaline Phosphatase: 103 U/L (ref 39–117)
BILIRUBIN TOTAL: 2.5 mg/dL — AB (ref 0.2–1.2)
BUN: 16 mg/dL (ref 6–23)
CO2: 27 mEq/L (ref 19–32)
Calcium: 8.8 mg/dL (ref 8.4–10.5)
Chloride: 106 mEq/L (ref 96–112)
Creat: 0.95 mg/dL (ref 0.50–1.35)
GFR, Est Non African American: >89 mL/min
Glucose, Bld: 77 mg/dL (ref 70–99)
Potassium: 4.2 mEq/L (ref 3.5–5.3)
Sodium: 141 mEq/L (ref 135–145)
Total Protein: 6.6 g/dL (ref 6.0–8.3)

## 2013-08-02 LAB — LIPID PANEL
CHOLESTEROL: 135 mg/dL (ref 0–200)
HDL: 37 mg/dL — ABNORMAL LOW (ref >39)
LDL Cholesterol: 82 mg/dL (ref 0–99)
TRIGLYCERIDES: 80 mg/dL (ref <150)
Total CHOL/HDL Ratio: 3.6 Ratio
VLDL: 16 mg/dL (ref 0–40)

## 2013-08-02 LAB — RPR

## 2013-08-02 LAB — SEDIMENTATION RATE: SED RATE: 5 mm/h (ref 0–16)

## 2013-08-03 LAB — T-HELPER CELL (CD4) - (RCID CLINIC ONLY)
CD4 T CELL HELPER: 30 % — AB (ref 33–55)
CD4 T Cell Abs: 540 /uL (ref 400–2700)

## 2013-08-03 LAB — HEPATITIS C ANTIBODY: HCV Ab: NEGATIVE

## 2013-08-03 LAB — HIV-1 RNA QUANT-NO REFLEX-BLD
HIV 1 RNA Quant: <20 copies/mL (ref <20)
HIV-1 RNA Quant, Log: <1.3 {Log} (ref <1.30)

## 2013-08-08 ENCOUNTER — Other Ambulatory Visit: Payer: Self-pay | Admitting: *Deleted

## 2013-08-08 DIAGNOSIS — B20 Human immunodeficiency virus [HIV] disease: Secondary | ICD-10-CM

## 2013-08-08 MED ORDER — EMTRICITABINE-TENOFOVIR DF 200-300 MG PO TABS: 1.0000 | ORAL_TABLET | Freq: Every day | ORAL | Status: DC

## 2013-08-08 MED ORDER — ATAZANAVIR-COBICISTAT 300-150 MG PO TABS: 1.0000 | ORAL_TABLET | Freq: Every day | ORAL | Status: DC

## 2013-08-08 NOTE — Telephone Encounter (Signed)
ADAP Application 

## 2013-08-10 ENCOUNTER — Other Ambulatory Visit: Payer: Self-pay | Admitting: *Deleted

## 2013-08-10 DIAGNOSIS — B20 Human immunodeficiency virus [HIV] disease: Secondary | ICD-10-CM

## 2013-08-10 MED ORDER — EMTRICITABINE-TENOFOVIR DF 200-300 MG PO TABS: 1.0000 | ORAL_TABLET | Freq: Every day | ORAL | Status: DC

## 2013-08-10 MED ORDER — ATAZANAVIR-COBICISTAT 300-150 MG PO TABS: 1.0000 | ORAL_TABLET | Freq: Every day | ORAL | Status: DC

## 2013-08-16 ENCOUNTER — Encounter: Payer: Self-pay | Admitting: Infectious Disease

## 2013-08-16 ENCOUNTER — Ambulatory Visit (INDEPENDENT_AMBULATORY_CARE_PROVIDER_SITE_OTHER): Payer: Medicare Other | Admitting: Infectious Disease

## 2013-08-16 VITALS — BP 131/84 | HR 62 | Temp 98.1°F | Wt 165.0 lb

## 2013-08-16 DIAGNOSIS — F419 Anxiety disorder, unspecified: Secondary | ICD-10-CM

## 2013-08-16 DIAGNOSIS — F329 Major depressive disorder, single episode, unspecified: Secondary | ICD-10-CM

## 2013-08-16 DIAGNOSIS — B20 Human immunodeficiency virus [HIV] disease: Secondary | ICD-10-CM

## 2013-08-16 DIAGNOSIS — F341 Dysthymic disorder: Secondary | ICD-10-CM

## 2013-08-16 DIAGNOSIS — Z79899 Other long term (current) drug therapy: Secondary | ICD-10-CM

## 2013-08-16 NOTE — Progress Notes (Signed)
  Subjective:    Patient ID: Darrell Schwartz, male    DOB: 1970-03-19, 43 y.o.   MRN: [id number]  HPI   Darrell Schwartz is a 43 y.o. male with HIV infection who had been doing superbly well on his antiviral regimen, of Reyataz Norvir and Truvada with undetectable viral load and health cd4 count.   He states that in past had been on Combivir and Sustiva which he could not tolerate due to"nightmares."  He had been initially cared for in [place], [place].   We changed him to [place] and Truvada and he feels much better after the change with much less problem with side effects and difficulty sleeping now much better rested and feels much much better.  Review of Systems  Constitutional: Negative for chills, diaphoresis, activity change, appetite change and unexpected weight change.  HENT: Negative for sinus pressure, sneezing and trouble swallowing.   Eyes: Negative for photophobia and visual disturbance.  Respiratory: Negative for chest tightness, wheezing and stridor.   Cardiovascular: Negative for chest pain, palpitations and leg swelling.  Gastrointestinal: Negative for nausea, abdominal pain, constipation, blood in stool, abdominal distention and anal bleeding.  Genitourinary: Negative for dysuria, hematuria, flank pain and difficulty urinating.  Musculoskeletal: Negative for arthralgias, back pain, gait problem, joint swelling and myalgias.  Skin: Negative for color change, pallor and wound.  Neurological: Negative for dizziness, tremors, weakness and light-headedness.  Hematological: Negative for adenopathy. Does not bruise/bleed easily.  Psychiatric/Behavioral: Negative for behavioral problems, confusion, sleep disturbance, dysphoric mood, decreased concentration and agitation.       Objective:   Physical Exam  Nursing note and vitals reviewed. Constitutional: He is oriented to person, place, and time. He appears well-developed and well-nourished. No distress.  HENT:  Head:  Normocephalic and atraumatic.  Mouth/Throat: Oropharynx is clear and moist. No oropharyngeal exudate.  Eyes: Conjunctivae and EOM are normal. No scleral icterus.  Neck: Normal range of motion. Neck supple. No JVD present.  Cardiovascular: Normal rate, regular rhythm and normal heart sounds.   Pulmonary/Chest: Effort normal. No respiratory distress. He has no wheezes.  Abdominal: He exhibits no distension. There is no tenderness.  Musculoskeletal: He exhibits no edema and no tenderness.  Lymphadenopathy:    He has no cervical adenopathy.  Neurological: He is alert and oriented to person, place, and time. He exhibits normal muscle tone. Coordination normal.  Skin: Skin is warm and dry. He is not diaphoretic. No erythema. No pallor.  Psychiatric: He has a normal mood and affect. His behavior is normal. Judgment and thought content normal.          Assessment & Plan:  HIV:  Simplifed EVOTAZ and TRuvada, ADAP is related. Consider [place] if Medicare will pay for this to consider ? He has any hidden mutations before we would ever consider changing to non PI regimen  I spent greater than 25 minutes with the patient including greater than 50% of time in face to face counsel of the patient and in coordination of their care.   Anxiety, Depression: followed by [facility] that he highly recommends

## 2013-08-24 ENCOUNTER — Ambulatory Visit: Payer: Medicare Other | Admitting: Infectious Disease

## 2013-09-13 ENCOUNTER — Encounter: Payer: Self-pay | Admitting: *Deleted

## 2014-01-30 ENCOUNTER — Other Ambulatory Visit: Payer: Medicare Other

## 2014-02-02 ENCOUNTER — Other Ambulatory Visit: Payer: Medicare Other

## 2014-02-13 ENCOUNTER — Other Ambulatory Visit: Payer: Medicare Other

## 2014-02-14 ENCOUNTER — Ambulatory Visit: Payer: Medicare Other | Admitting: Infectious Disease

## 2014-03-07 ENCOUNTER — Ambulatory Visit: Payer: Medicare Other | Admitting: Infectious Disease

## 2014-03-13 ENCOUNTER — Other Ambulatory Visit (HOSPITAL_COMMUNITY)
Admission: RE | Admit: 2014-03-13 | Discharge: 2014-03-13 | Disposition: A | Discharge status: Home / Self Care | Payer: Medicare Other | Source: Ambulatory Visit | Attending: Infectious Diseases | Admitting: Infectious Diseases

## 2014-03-13 ENCOUNTER — Ambulatory Visit: Payer: Medicare Other

## 2014-03-13 ENCOUNTER — Other Ambulatory Visit: Payer: Medicare Other

## 2014-03-13 DIAGNOSIS — Z113 Encounter for screening for infections with a predominantly sexual mode of transmission: Secondary | ICD-10-CM | POA: Diagnosis present

## 2014-03-13 DIAGNOSIS — Z79899 Other long term (current) drug therapy: Secondary | ICD-10-CM

## 2014-03-13 DIAGNOSIS — B20 Human immunodeficiency virus [HIV] disease: Secondary | ICD-10-CM

## 2014-03-13 LAB — COMPLETE METABOLIC PANEL WITH GFR
ALK PHOS: 98 U/L (ref 39–117)
ALT: 14 U/L (ref 0–53)
AST: 18 U/L (ref 0–37)
Albumin: 3.9 g/dL (ref 3.5–5.2)
BUN: 16 mg/dL (ref 6–23)
CO2: 30 meq/L (ref 19–32)
Calcium: 9 mg/dL (ref 8.4–10.5)
Chloride: 103 mEq/L (ref 96–112)
Creat: 0.89 mg/dL (ref 0.50–1.35)
GFR, Est African American: >89 mL/min
Glucose, Bld: 79 mg/dL (ref 70–99)
POTASSIUM: 4.3 meq/L (ref 3.5–5.3)
SODIUM: 138 meq/L (ref 135–145)
Total Bilirubin: 2 mg/dL — ABNORMAL HIGH (ref 0.2–1.2)
Total Protein: 6.7 g/dL (ref 6.0–8.3)

## 2014-03-13 LAB — CBC WITH DIFFERENTIAL/PLATELET
Basophils Absolute: 0.1 10*3/uL (ref 0.0–0.1)
Basophils Relative: 1 % (ref 0–1)
EOS ABS: 0.1 10*3/uL (ref 0.0–0.7)
EOS PCT: 2 % (ref 0–5)
HCT: 44.9 % (ref 39.0–52.0)
HEMOGLOBIN: 15.2 g/dL (ref 13.0–17.0)
LYMPHS PCT: 26 % (ref 12–46)
Lymphs Abs: 1.5 10*3/uL (ref 0.7–4.0)
MCH: 33.4 pg (ref 26.0–34.0)
MCHC: 33.9 g/dL (ref 30.0–36.0)
MCV: 98.7 fL (ref 78.0–100.0)
MPV: 9.6 fL (ref 8.6–12.4)
Monocytes Absolute: 0.4 10*3/uL (ref 0.1–1.0)
Monocytes Relative: 7 % (ref 3–12)
NEUTROS PCT: 64 % (ref 43–77)
Neutro Abs: 3.8 10*3/uL (ref 1.7–7.7)
PLATELETS: 161 10*3/uL (ref 150–400)
RBC: 4.55 MIL/uL (ref 4.22–5.81)
RDW: 13.5 % (ref 11.5–15.5)
WBC: 5.9 10*3/uL (ref 4.0–10.5)

## 2014-03-13 LAB — LIPID PANEL
Cholesterol: 141 mg/dL (ref 0–200)
HDL: 45 mg/dL (ref >39)
LDL Cholesterol: 80 mg/dL (ref 0–99)
Total CHOL/HDL Ratio: 3.1 Ratio
Triglycerides: 79 mg/dL (ref <150)
VLDL: 16 mg/dL (ref 0–40)

## 2014-03-14 ENCOUNTER — Other Ambulatory Visit: Payer: Self-pay | Admitting: *Deleted

## 2014-03-14 DIAGNOSIS — B20 Human immunodeficiency virus [HIV] disease: Secondary | ICD-10-CM

## 2014-03-14 LAB — T-HELPER CELL (CD4) - (RCID CLINIC ONLY)
CD4 % Helper T Cell: 31 % — ABNORMAL LOW (ref 33–55)
CD4 T Cell Abs: 480 /uL (ref 400–2700)

## 2014-03-14 LAB — MICROALBUMIN / CREATININE URINE RATIO
Creatinine, Urine: 132.5 mg/dL
Microalb Creat Ratio: 3 mg/g (ref 0.0–30.0)
Microalb, Ur: 0.4 mg/dL (ref <2.0)

## 2014-03-14 LAB — HIV-1 RNA QUANT-NO REFLEX-BLD: HIV-1 RNA Quant, Log: <1.3 {Log} (ref <1.30)

## 2014-03-14 LAB — RPR

## 2014-03-14 LAB — URINE CYTOLOGY ANCILLARY ONLY
CHLAMYDIA, DNA PROBE: NEGATIVE
NEISSERIA GONORRHEA: NEGATIVE

## 2014-03-14 MED ORDER — EMTRICITABINE-TENOFOVIR DF 200-300 MG PO TABS: 1.0000 | ORAL_TABLET | Freq: Every day | ORAL | Status: DC

## 2014-03-14 MED ORDER — ATAZANAVIR-COBICISTAT 300-150 MG PO TABS: 1.0000 | ORAL_TABLET | Freq: Every day | ORAL | Status: DC

## 2014-03-14 NOTE — Telephone Encounter (Signed)
ADAP Application 

## 2014-04-11 ENCOUNTER — Ambulatory Visit: Payer: Medicare Other | Admitting: Infectious Disease

## 2014-04-19 ENCOUNTER — Ambulatory Visit: Payer: Medicare Other | Admitting: Infectious Disease

## 2014-05-08 ENCOUNTER — Other Ambulatory Visit: Payer: Self-pay | Admitting: Infectious Disease

## 2014-05-09 ENCOUNTER — Other Ambulatory Visit: Payer: Self-pay | Admitting: Infectious Disease

## 2014-05-09 DIAGNOSIS — B20 Human immunodeficiency virus [HIV] disease: Secondary | ICD-10-CM

## 2014-05-31 ENCOUNTER — Other Ambulatory Visit: Payer: Self-pay | Admitting: Infectious Disease

## 2014-06-11 IMAGING — CR DG CHEST 2V
2 series · 2 of 2 positions shown · non-contrast
Comparison: [facility] CARDIAC PHARM REST/STRESS dated 10/25/2009; DG CHEST
PORTABLE dated 10/10/2009

CLINICAL DATA: Recurrent cough.  History of HIV

EXAM:
CHEST  2 VIEW

[view not recorded (1 of 2)]
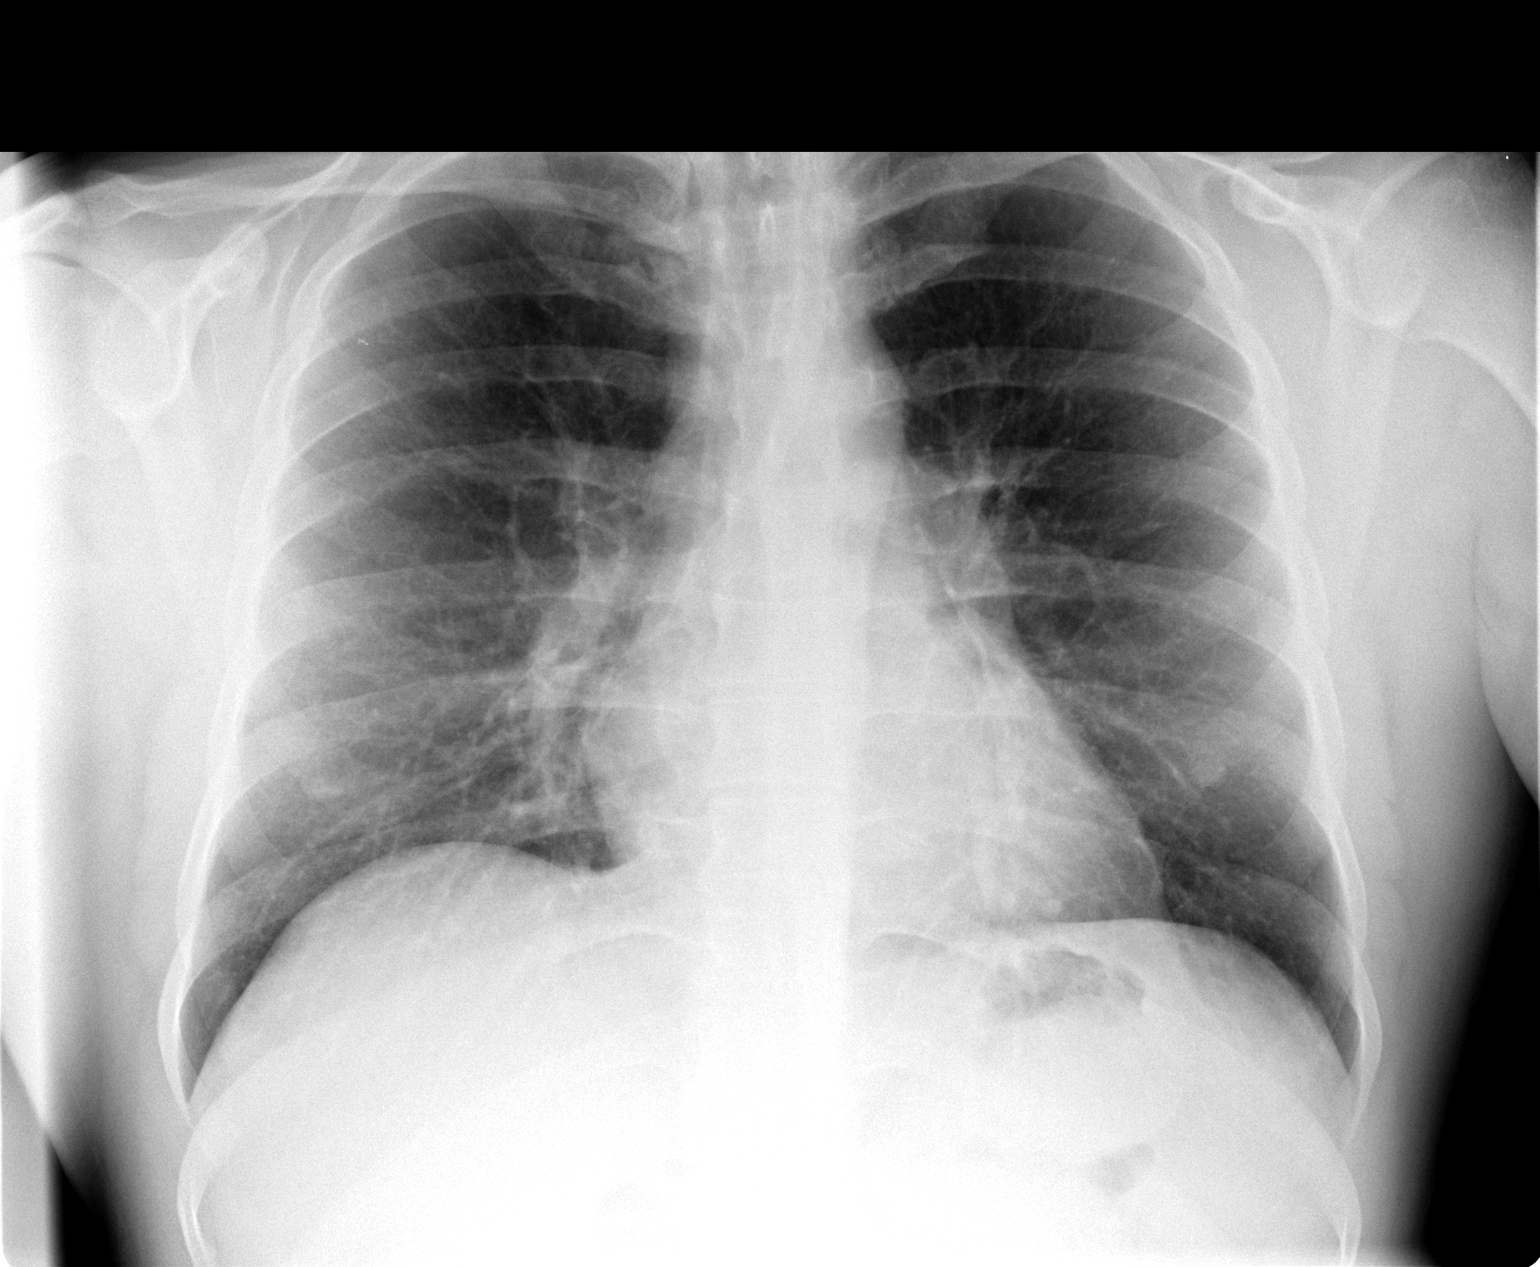

[view not recorded (2 of 2)]
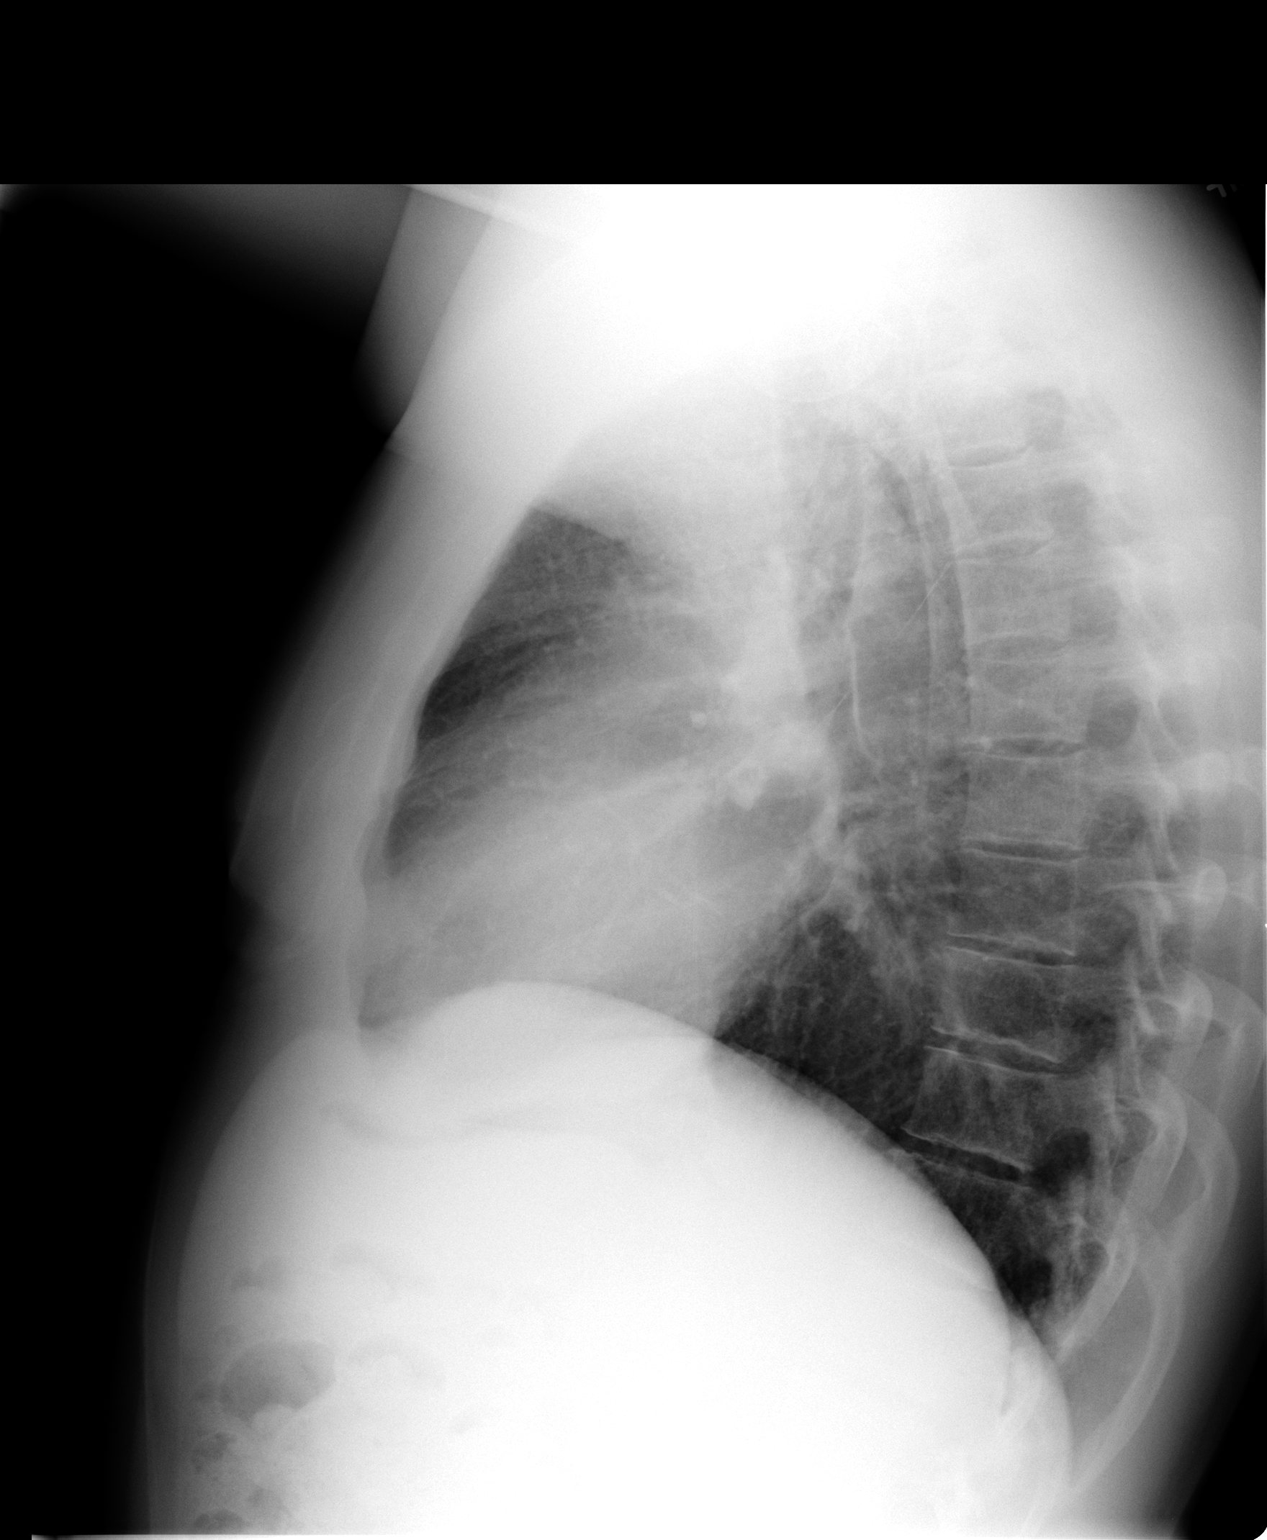

[2 of 2 positions shown; findings below may reference images not displayed]

FINDINGS: Normal cardiac silhouette. No effusion, infiltrate, or pneumothorax.
Prominent nipple shadows noted no aggressive osseous lesion.
IMPRESSION: No acute cardiopulmonary process.  No change from prior.

## 2014-07-09 ENCOUNTER — Other Ambulatory Visit: Payer: Self-pay | Admitting: Infectious Disease

## 2014-08-06 ENCOUNTER — Other Ambulatory Visit: Payer: Self-pay | Admitting: Infectious Disease

## 2014-08-13 ENCOUNTER — Other Ambulatory Visit: Payer: Self-pay | Admitting: Internal Medicine

## 2014-08-13 MED ORDER — ATAZANAVIR-COBICISTAT 300-150 MG PO TABS: 1.0000 | ORAL_TABLET | Freq: Every day | ORAL | Status: DC

## 2014-08-13 MED ORDER — EMTRICITABINE-TENOFOVIR DF 200-300 MG PO TABS: 1.0000 | ORAL_TABLET | Freq: Every day | ORAL | Status: DC

## 2014-09-17 ENCOUNTER — Ambulatory Visit (INDEPENDENT_AMBULATORY_CARE_PROVIDER_SITE_OTHER): Payer: Medicare Other | Admitting: Infectious Disease

## 2014-09-17 ENCOUNTER — Encounter: Payer: Self-pay | Admitting: Infectious Disease

## 2014-09-17 ENCOUNTER — Other Ambulatory Visit: Payer: Medicare Other

## 2014-09-17 VITALS — BP 135/80 | HR 87 | Temp 97.9°F | Wt 167.0 lb

## 2014-09-17 DIAGNOSIS — H547 Unspecified visual loss: Secondary | ICD-10-CM

## 2014-09-17 DIAGNOSIS — B20 Human immunodeficiency virus [HIV] disease: Secondary | ICD-10-CM | POA: Diagnosis not present

## 2014-09-17 DIAGNOSIS — F341 Dysthymic disorder: Secondary | ICD-10-CM

## 2014-09-17 DIAGNOSIS — E785 Hyperlipidemia, unspecified: Secondary | ICD-10-CM

## 2014-09-17 HISTORY — DX: Unspecified visual loss: H54.7

## 2014-09-17 LAB — CBC WITH DIFFERENTIAL/PLATELET
BASOS PCT: 0 % (ref 0–1)
Basophils Absolute: 0 10*3/uL (ref 0.0–0.1)
Eosinophils Absolute: 0.1 10*3/uL (ref 0.0–0.7)
Eosinophils Relative: 2 % (ref 0–5)
HCT: 44.5 % (ref 39.0–52.0)
Hemoglobin: 15.5 g/dL (ref 13.0–17.0)
Lymphocytes Relative: 26 % (ref 12–46)
Lymphs Abs: 1.7 10*3/uL (ref 0.7–4.0)
MCH: 34.2 pg — AB (ref 26.0–34.0)
MCHC: 34.8 g/dL (ref 30.0–36.0)
MCV: 98.2 fL (ref 78.0–100.0)
MONOS PCT: 6 % (ref 3–12)
MPV: 9.8 fL (ref 8.6–12.4)
Monocytes Absolute: 0.4 10*3/uL (ref 0.1–1.0)
Neutro Abs: 4.3 10*3/uL (ref 1.7–7.7)
Neutrophils Relative %: 66 % (ref 43–77)
Platelets: 150 10*3/uL (ref 150–400)
RBC: 4.53 MIL/uL (ref 4.22–5.81)
RDW: 12.7 % (ref 11.5–15.5)
WBC: 6.5 10*3/uL (ref 4.0–10.5)

## 2014-09-17 LAB — COMPLETE METABOLIC PANEL WITH GFR
ALBUMIN: 4.2 g/dL (ref 3.6–5.1)
ALK PHOS: 111 U/L (ref 40–115)
ALT: 32 U/L (ref 9–46)
AST: 26 U/L (ref 10–40)
BILIRUBIN TOTAL: 2.9 mg/dL — AB (ref 0.2–1.2)
BUN: 10 mg/dL (ref 7–25)
CO2: 29 mmol/L (ref 20–31)
CREATININE: 0.92 mg/dL (ref 0.60–1.35)
Calcium: 9.1 mg/dL (ref 8.6–10.3)
Chloride: 101 mmol/L (ref 98–110)
GFR, Est African American: >89 mL/min (ref >=60)
GLUCOSE: 113 mg/dL — AB (ref 65–99)
Potassium: 4.5 mmol/L (ref 3.5–5.3)
Sodium: 141 mmol/L (ref 135–146)
TOTAL PROTEIN: 7.2 g/dL (ref 6.1–8.1)

## 2014-09-17 MED ORDER — EMTRICITABINE-TENOFOVIR AF 200-25 MG PO TABS: 1.0000 | ORAL_TABLET | Freq: Every day | ORAL | Status: DC

## 2014-09-17 NOTE — Progress Notes (Signed)
  Subjective:    Patient ID: Darrell Schwartz, male    DOB: August 14, 1970, 44 y.o.   MRN: [id number]  HPI   MALCOLM QUAST is a 44 y.o. male with HIV infection who had been doing superbly well on his antiviral regimen, of Evotaz and truvada undetectable viral load and health cd4 count.   He states that in past had been on Combivir and Sustiva which he could not tolerate due to"nightmares."  He had been initially cared for in [place], [place].   We changed him to [place] and Truvada and he feels much better and not needing ADHD.  His aunt has been ill and suffering from dementia and is now on hospice in [facility].  He has renewed ADAP last week but has one month supply of meds.   hReview of Systems  Constitutional: Negative for chills, diaphoresis, activity change, appetite change and unexpected weight change.  HENT: Negative for sinus pressure, sneezing and trouble swallowing.   Eyes: Negative for photophobia and visual disturbance.  Respiratory: Negative for chest tightness, wheezing and stridor.   Cardiovascular: Negative for chest pain, palpitations and leg swelling.  Gastrointestinal: Negative for nausea, abdominal pain, constipation, blood in stool, abdominal distention and anal bleeding.  Genitourinary: Negative for dysuria, hematuria, flank pain and difficulty urinating.  Musculoskeletal: Negative for myalgias, back pain, joint swelling, arthralgias and gait problem.  Skin: Negative for color change, pallor and wound.  Neurological: Negative for dizziness, tremors, weakness and light-headedness.  Hematological: Negative for adenopathy. Does not bruise/bleed easily.  Psychiatric/Behavioral: Negative for behavioral problems, confusion, sleep disturbance, dysphoric mood, decreased concentration and agitation.       Objective:   Physical Exam  Constitutional: He is oriented to person, place, and time. He appears well-developed and well-nourished. No distress.  HENT:  Head: Normocephalic  and atraumatic.  Mouth/Throat: Oropharynx is clear and moist. No oropharyngeal exudate.  Eyes: Conjunctivae and EOM are normal. No scleral icterus.  Neck: Normal range of motion. Neck supple. No JVD present.  Cardiovascular: Normal rate, regular rhythm and normal heart sounds.   Pulmonary/Chest: Effort normal. No respiratory distress. He has no wheezes.  Abdominal: He exhibits no distension. There is no tenderness.  Musculoskeletal: He exhibits no edema or tenderness.  Lymphadenopathy:    He has no cervical adenopathy.  Neurological: He is alert and oriented to person, place, and time. He exhibits normal muscle tone. Coordination normal.  Skin: Skin is warm and dry. He is not diaphoretic. No erythema. No pallor.  Psychiatric: He has a normal mood and affect. His behavior is normal. Judgment and thought content normal.  Nursing note and vitals reviewed.         Assessment & Plan:  HIV:  chagne to EVOTAZ and Descovy  Anxiety, Depression: followed by [facility] that he highly recommends\  Decreased visual acuity: inquiring about how to see someone re his eyes and need for glasses: recommended optometry check

## 2014-09-18 ENCOUNTER — Other Ambulatory Visit: Payer: Self-pay | Admitting: Infectious Disease

## 2014-09-18 DIAGNOSIS — B20 Human immunodeficiency virus [HIV] disease: Secondary | ICD-10-CM

## 2014-09-18 LAB — HIV-1 RNA QUANT-NO REFLEX-BLD
HIV 1 RNA Quant: <20 copies/mL (ref <20)
HIV-1 RNA Quant, Log: <1.3 {Log} (ref <1.30)

## 2014-09-18 LAB — T-HELPER CELL (CD4) - (RCID CLINIC ONLY)
CD4 T CELL HELPER: 31 % — AB (ref 33–55)
CD4 T Cell Abs: 530 /uL (ref 400–2700)

## 2015-02-18 ENCOUNTER — Ambulatory Visit: Payer: Medicare Other

## 2015-02-18 ENCOUNTER — Other Ambulatory Visit: Payer: Medicare Other

## 2015-02-18 DIAGNOSIS — B20 Human immunodeficiency virus [HIV] disease: Secondary | ICD-10-CM

## 2015-02-18 DIAGNOSIS — E785 Hyperlipidemia, unspecified: Secondary | ICD-10-CM

## 2015-02-18 LAB — LIPID PANEL
Cholesterol: 147 mg/dL (ref 125–200)
HDL: 38 mg/dL — AB (ref >=40)
LDL CALC: 86 mg/dL (ref <130)
Total CHOL/HDL Ratio: 3.9 Ratio (ref <=5.0)
Triglycerides: 117 mg/dL (ref <150)
VLDL: 23 mg/dL (ref <30)

## 2015-02-18 LAB — COMPLETE METABOLIC PANEL WITH GFR
ALBUMIN: 4 g/dL (ref 3.6–5.1)
ALK PHOS: 89 U/L (ref 40–115)
ALT: 17 U/L (ref 9–46)
AST: 15 U/L (ref 10–40)
BILIRUBIN TOTAL: 2 mg/dL — AB (ref 0.2–1.2)
BUN: 15 mg/dL (ref 7–25)
CO2: 25 mmol/L (ref 20–31)
CREATININE: 0.82 mg/dL (ref 0.60–1.35)
Calcium: 8.9 mg/dL (ref 8.6–10.3)
Chloride: 105 mmol/L (ref 98–110)
GFR, Est Non African American: >89 mL/min (ref >=60)
GLUCOSE: 97 mg/dL (ref 65–99)
Potassium: 4.2 mmol/L (ref 3.5–5.3)
Sodium: 141 mmol/L (ref 135–146)
TOTAL PROTEIN: 6.5 g/dL (ref 6.1–8.1)

## 2015-02-18 LAB — CBC WITH DIFFERENTIAL/PLATELET
BASOS PCT: 0 % (ref 0–1)
Basophils Absolute: 0 10*3/uL (ref 0.0–0.1)
EOS ABS: 0.1 10*3/uL (ref 0.0–0.7)
Eosinophils Relative: 2 % (ref 0–5)
HCT: 43 % (ref 39.0–52.0)
Hemoglobin: 14.7 g/dL (ref 13.0–17.0)
Lymphocytes Relative: 22 % (ref 12–46)
Lymphs Abs: 1.6 10*3/uL (ref 0.7–4.0)
MCH: 34.1 pg — AB (ref 26.0–34.0)
MCHC: 34.2 g/dL (ref 30.0–36.0)
MCV: 99.8 fL (ref 78.0–100.0)
MONOS PCT: 7 % (ref 3–12)
MPV: 9.9 fL (ref 8.6–12.4)
Monocytes Absolute: 0.5 10*3/uL (ref 0.1–1.0)
NEUTROS PCT: 69 % (ref 43–77)
Neutro Abs: 5 10*3/uL (ref 1.7–7.7)
PLATELETS: 162 10*3/uL (ref 150–400)
RBC: 4.31 MIL/uL (ref 4.22–5.81)
RDW: 12.9 % (ref 11.5–15.5)
WBC: 7.2 10*3/uL (ref 4.0–10.5)

## 2015-02-19 LAB — HIV-1 RNA QUANT-NO REFLEX-BLD

## 2015-02-19 LAB — RPR

## 2015-02-20 LAB — T-HELPER CELL (CD4) - (RCID CLINIC ONLY)
CD4 T CELL ABS: 450 /uL (ref 400–2700)
CD4 T CELL HELPER: 30 % — AB (ref 33–55)

## 2015-02-27 ENCOUNTER — Ambulatory Visit: Payer: Medicare Other | Admitting: Infectious Disease

## 2015-04-12 ENCOUNTER — Other Ambulatory Visit: Payer: Self-pay | Admitting: Internal Medicine

## 2015-04-12 ENCOUNTER — Encounter: Payer: Self-pay | Admitting: Infectious Disease

## 2015-04-12 MED ORDER — AZITHROMYCIN 250 MG PO TABS: ORAL_TABLET | ORAL | Status: DC

## 2015-04-14 ENCOUNTER — Other Ambulatory Visit: Payer: Self-pay | Admitting: Internal Medicine

## 2015-04-14 DIAGNOSIS — J189 Pneumonia, unspecified organism: Secondary | ICD-10-CM

## 2015-04-14 MED ORDER — AZITHROMYCIN 250 MG PO TABS: ORAL_TABLET | ORAL | Status: DC

## 2015-04-14 NOTE — Progress Notes (Signed)
[facility] called stating that [facility]. He has persistent cough, nasal congestion, course phonation, ? Chills. [facility] [place]

## 2015-04-22 ENCOUNTER — Other Ambulatory Visit: Payer: Self-pay | Admitting: *Deleted

## 2015-04-22 DIAGNOSIS — B20 Human immunodeficiency virus [HIV] disease: Secondary | ICD-10-CM

## 2015-04-22 MED ORDER — ATAZANAVIR-COBICISTAT 300-150 MG PO TABS: ORAL_TABLET | ORAL | Status: DC

## 2015-04-23 ENCOUNTER — Other Ambulatory Visit: Payer: Self-pay | Admitting: *Deleted

## 2015-04-23 DIAGNOSIS — B20 Human immunodeficiency virus [HIV] disease: Secondary | ICD-10-CM

## 2015-04-23 MED ORDER — ATAZANAVIR-COBICISTAT 300-150 MG PO TABS: ORAL_TABLET | ORAL | Status: DC

## 2015-08-06 ENCOUNTER — Ambulatory Visit: Payer: Medicare Other

## 2015-08-06 ENCOUNTER — Other Ambulatory Visit: Payer: Medicare Other

## 2015-08-06 ENCOUNTER — Ambulatory Visit: Payer: Medicare Other | Admitting: Infectious Disease

## 2015-08-12 ENCOUNTER — Ambulatory Visit: Payer: Medicare Other

## 2015-09-12 ENCOUNTER — Ambulatory Visit: Payer: Medicare Other | Admitting: Infectious Disease

## 2015-09-12 ENCOUNTER — Other Ambulatory Visit: Payer: Medicare Other

## 2015-09-19 ENCOUNTER — Other Ambulatory Visit: Payer: Self-pay | Admitting: Infectious Disease

## 2015-10-16 ENCOUNTER — Other Ambulatory Visit: Payer: Self-pay | Admitting: Infectious Disease

## 2015-10-16 ENCOUNTER — Other Ambulatory Visit: Payer: Self-pay | Admitting: Infectious Diseases

## 2015-10-16 DIAGNOSIS — B20 Human immunodeficiency virus [HIV] disease: Secondary | ICD-10-CM

## 2016-02-10 ENCOUNTER — Ambulatory Visit (INDEPENDENT_AMBULATORY_CARE_PROVIDER_SITE_OTHER): Payer: Medicare Other | Admitting: Infectious Disease

## 2016-02-10 ENCOUNTER — Encounter: Payer: Self-pay | Admitting: Infectious Disease

## 2016-02-10 VITALS — BP 133/93 | HR 75 | Temp 97.8°F | Ht 69.0 in | Wt 179.0 lb

## 2016-02-10 DIAGNOSIS — Z79899 Other long term (current) drug therapy: Secondary | ICD-10-CM | POA: Diagnosis not present

## 2016-02-10 DIAGNOSIS — B20 Human immunodeficiency virus [HIV] disease: Secondary | ICD-10-CM

## 2016-02-10 DIAGNOSIS — Z716 Tobacco abuse counseling: Secondary | ICD-10-CM | POA: Insufficient documentation

## 2016-02-10 DIAGNOSIS — Z23 Encounter for immunization: Secondary | ICD-10-CM | POA: Diagnosis not present

## 2016-02-10 DIAGNOSIS — Z113 Encounter for screening for infections with a predominantly sexual mode of transmission: Secondary | ICD-10-CM | POA: Diagnosis not present

## 2016-02-10 DIAGNOSIS — Z789 Other specified health status: Secondary | ICD-10-CM | POA: Diagnosis not present

## 2016-02-10 HISTORY — DX: Other specified health status: Z78.9

## 2016-02-10 HISTORY — DX: Other long term (current) drug therapy: Z79.899

## 2016-02-10 HISTORY — DX: Encounter for screening for infections with a predominantly sexual mode of transmission: Z11.3

## 2016-02-10 LAB — CBC WITH DIFFERENTIAL/PLATELET
BASOS PCT: 1 %
Basophils Absolute: 68 cells/uL (ref 0–200)
Eosinophils Absolute: 204 cells/uL (ref 15–500)
Eosinophils Relative: 3 %
HEMATOCRIT: 45.6 % (ref 38.5–50.0)
HEMOGLOBIN: 15.6 g/dL (ref 13.2–17.1)
LYMPHS ABS: 2040 {cells}/uL (ref 850–3900)
LYMPHS PCT: 30 %
MCH: 32.6 pg (ref 27.0–33.0)
MCHC: 34.2 g/dL (ref 32.0–36.0)
MCV: 95.4 fL (ref 80.0–100.0)
MPV: 9.8 fL (ref 7.5–12.5)
Monocytes Absolute: 544 cells/uL (ref 200–950)
Monocytes Relative: 8 %
NEUTROS ABS: 3944 {cells}/uL (ref 1500–7800)
Neutrophils Relative %: 58 %
Platelets: 164 10*3/uL (ref 140–400)
RBC: 4.78 MIL/uL (ref 4.20–5.80)
RDW: 13.5 % (ref 11.0–15.0)
WBC: 6.8 10*3/uL (ref 3.8–10.8)

## 2016-02-10 LAB — LIPID PANEL
Cholesterol: 208 mg/dL — ABNORMAL HIGH (ref <200)
HDL: 43 mg/dL (ref >40)
LDL CALC: 138 mg/dL — AB (ref <100)
TRIGLYCERIDES: 137 mg/dL (ref <150)
Total CHOL/HDL Ratio: 4.8 Ratio (ref <5.0)
VLDL: 27 mg/dL (ref <30)

## 2016-02-10 LAB — COMPLETE METABOLIC PANEL WITH GFR
ALBUMIN: 4.4 g/dL (ref 3.6–5.1)
ALT: 26 U/L (ref 9–46)
AST: 20 U/L (ref 10–40)
Alkaline Phosphatase: 105 U/L (ref 40–115)
BILIRUBIN TOTAL: 2.6 mg/dL — AB (ref 0.2–1.2)
BUN: 13 mg/dL (ref 7–25)
CO2: 27 mmol/L (ref 20–31)
Calcium: 9.2 mg/dL (ref 8.6–10.3)
Chloride: 105 mmol/L (ref 98–110)
Creat: 0.86 mg/dL (ref 0.60–1.35)
GFR, Est African American: >89 mL/min (ref >=60)
GFR, Est Non African American: >89 mL/min (ref >=60)
GLUCOSE: 86 mg/dL (ref 65–99)
Potassium: 4.3 mmol/L (ref 3.5–5.3)
SODIUM: 139 mmol/L (ref 135–146)
TOTAL PROTEIN: 7.4 g/dL (ref 6.1–8.1)

## 2016-02-10 NOTE — Addendum Note (Signed)
Addended by: Dolan Amen D on: 02/10/2016 04:58 PM   Modules accepted: Orders

## 2016-02-10 NOTE — Progress Notes (Signed)
Subjective:   Chief complaint: follow-up for HIV   Patient ID: Darrell Schwartz, male    DOB: Apr 15, 1970, 46 y.o.   MRN: [id number]  HPI  Darrell Schwartz is a 46 y.o. male with HIV disease  who had been doing superbly well on his antiviral regimen, of Evotaz and truvada undetectable viral load and health cd4 count.   He states that in past had been on Combivir and Sustiva which he could not tolerate due to"nightmares."  He had been initially cared for in [place], [place].   We changed him to EVOTAZ and Truvada --> Evotaz and Descovy  He now has insurance.   Lab Results  Component Value Date   HIV1RNAQUANT <20 02/18/2015   HIV1RNAQUANT <20 09/17/2014   HIV1RNAQUANT <20 03/13/2014   Lab Results  Component Value Date   CD4TABS 450 02/18/2015   CD4TABS 530 09/17/2014   CD4TABS 480 03/13/2014    Past Medical History:  Diagnosis Date  . Allergy   . Anxiety   . Decreased visual acuity 09/17/2014  . Encounter for long-term (current) use of medications 02/10/2016  . Hepatitis B immune 02/10/2016  . HIV infection ([place])   . Hypertension   . Routine screening for STI (sexually transmitted infection) 02/10/2016  . Stroke [facility])     No past surgical history on file.  Family History  Problem Relation Age of Onset  . Heart disease Mother   . Heart disease Father       Social History   Social History  . Marital status: Married    Spouse name: N/A  . Number of children: N/A  . Years of education: N/A   Social History Main Topics  . Smoking status: Former Smoker    Packs/day: 0.25    Years: 17.00    Types: Cigarettes    Start date: 07/19/2013  . Smokeless tobacco: Never Used     Comment: using electronic cigarettes  . Alcohol use 1.0 oz/week    2 drink(s) per week     Comment: rarely  . Drug use: No  . Sexual activity: Yes     Comment: pt. given condoms   Other Topics Concern  . None   Social History Narrative  . None    No Known Allergies   Current  Outpatient Prescriptions:  .  ALPRAZolam (XANAX) 1 MG tablet, Take 1 tablet (1 mg total) by mouth 4 (four) times daily as needed., Disp: 30 tablet, Rfl: 1 .  DESCOVY 200-25 MG tablet, TAKE 1 TABLET BY MOUTH DAILY, Disp: 30 tablet, Rfl: 5 .  EVOTAZ 300-150 MG tablet, TAKE 1 TABLET BY MOUTH DAILY WITH FOOD, SWALLOW WHOLE, DO NOT CRUSH, CUT OR CHEW, Disp: 30 tablet, Rfl: 5 .  azithromycin (ZITHROMAX) 250 MG tablet, Take 2 tabs by mouth on day 1, followed by 1 tab daily until complete (Patient not taking: Reported on 02/10/2016), Disp: 6 each, Rfl: 0 .  sertraline (ZOLOFT) 50 MG tablet, , Disp: , Rfl:     hReview of Systems  Constitutional: Negative for activity change, appetite change, chills, diaphoresis and unexpected weight change.  HENT: Negative for sinus pressure, sneezing and trouble swallowing.   Eyes: Negative for photophobia and visual disturbance.  Respiratory: Negative for chest tightness, wheezing and stridor.   Cardiovascular: Negative for chest pain, palpitations and leg swelling.  Gastrointestinal: Negative for abdominal distention, abdominal pain, anal bleeding, blood in stool, constipation and nausea.  Genitourinary: Negative for difficulty urinating, dysuria, flank pain and hematuria.  Musculoskeletal: Negative for arthralgias, back pain, gait problem, joint swelling and myalgias.  Skin: Negative for color change, pallor and wound.  Neurological: Negative for dizziness, tremors, weakness and light-headedness.  Hematological: Negative for adenopathy. Does not bruise/bleed easily.  Psychiatric/Behavioral: Negative for agitation, behavioral problems, confusion, decreased concentration, dysphoric mood and sleep disturbance.       Objective:   Physical Exam  Constitutional: He is oriented to person, place, and time. He appears well-developed and well-nourished. No distress.  HENT:  Head: Normocephalic and atraumatic.  Mouth/Throat: Oropharynx is clear and moist. No  oropharyngeal exudate.  Eyes: Conjunctivae and EOM are normal. No scleral icterus.  Neck: Normal range of motion. Neck supple. No JVD present.  Cardiovascular: Normal rate, regular rhythm and normal heart sounds.   Pulmonary/Chest: Effort normal. No respiratory distress. He has no wheezes.  Abdominal: He exhibits no distension. There is no tenderness.  Musculoskeletal: He exhibits no edema or tenderness.  Lymphadenopathy:    He has no cervical adenopathy.  Neurological: He is alert and oriented to person, place, and time. He exhibits normal muscle tone. Coordination normal.  Skin: Skin is warm and dry. He is not diaphoretic. No erythema. No pallor.  Psychiatric: He has a normal mood and affect. His behavior is normal. Judgment and thought content normal.  Nursing note and vitals reviewed.         Assessment & Plan:  HIV: continue EVOTAZ and Descovy, RTC in 6 months, Vaccinate for meningococcus.  Anxiety, Depression: followed by [facility] that he highly recommends   I spent greater than 25 minutes with the patient including greater than 50% of time in face to face counsel of the patient  During [name] and need for meningococcal vaccination and in coordination of his care.

## 2016-02-11 LAB — RPR

## 2016-02-12 LAB — T-HELPER CELL (CD4) - (RCID CLINIC ONLY)
CD4 % Helper T Cell: 29 % — ABNORMAL LOW (ref 33–55)
CD4 T Cell Abs: 640 /uL (ref 400–2700)

## 2016-02-14 LAB — HIV RNA, RTPCR W/R GT (RTI, PI,INT)
HIV-1 RNA, QN PCR: <1.3 Log copies/mL
HIV-1 RNA, QN PCR: <20 copies/mL

## 2016-03-10 ENCOUNTER — Ambulatory Visit: Payer: Medicare Other

## 2016-03-12 ENCOUNTER — Encounter: Payer: Self-pay | Admitting: Infectious Disease

## 2016-04-16 ENCOUNTER — Other Ambulatory Visit: Payer: Self-pay | Admitting: Infectious Disease

## 2016-04-16 DIAGNOSIS — B20 Human immunodeficiency virus [HIV] disease: Secondary | ICD-10-CM

## 2016-07-08 ENCOUNTER — Ambulatory Visit: Payer: Medicare Other | Admitting: Infectious Disease

## 2016-07-20 ENCOUNTER — Other Ambulatory Visit: Payer: Medicare Other

## 2016-07-20 DIAGNOSIS — B20 Human immunodeficiency virus [HIV] disease: Secondary | ICD-10-CM

## 2016-07-20 LAB — COMPREHENSIVE METABOLIC PANEL
ALK PHOS: 106 U/L (ref 40–115)
ALT: 25 U/L (ref 9–46)
AST: 23 U/L (ref 10–40)
Albumin: 4.2 g/dL (ref 3.6–5.1)
BILIRUBIN TOTAL: 3.7 mg/dL — AB (ref 0.2–1.2)
BUN: 14 mg/dL (ref 7–25)
CALCIUM: 9.4 mg/dL (ref 8.6–10.3)
CO2: 27 mmol/L (ref 20–31)
Chloride: 103 mmol/L (ref 98–110)
Creat: 1.11 mg/dL (ref 0.60–1.35)
GLUCOSE: 88 mg/dL (ref 65–99)
POTASSIUM: 4.5 mmol/L (ref 3.5–5.3)
Sodium: 139 mmol/L (ref 135–146)
TOTAL PROTEIN: 7.6 g/dL (ref 6.1–8.1)

## 2016-07-20 LAB — CBC WITH DIFFERENTIAL/PLATELET
BASOS ABS: 0 {cells}/uL (ref 0–200)
Basophils Relative: 0 %
EOS PCT: 1 %
Eosinophils Absolute: 62 cells/uL (ref 15–500)
HCT: 46.4 % (ref 38.5–50.0)
HEMOGLOBIN: 15.7 g/dL (ref 13.2–17.1)
LYMPHS ABS: 2170 {cells}/uL (ref 850–3900)
Lymphocytes Relative: 35 %
MCH: 32.8 pg (ref 27.0–33.0)
MCHC: 33.8 g/dL (ref 32.0–36.0)
MCV: 97.1 fL (ref 80.0–100.0)
MONOS PCT: 9 %
MPV: 10.2 fL (ref 7.5–12.5)
Monocytes Absolute: 558 cells/uL (ref 200–950)
NEUTROS PCT: 55 %
Neutro Abs: 3410 cells/uL (ref 1500–7800)
PLATELETS: 161 10*3/uL (ref 140–400)
RBC: 4.78 MIL/uL (ref 4.20–5.80)
RDW: 13.1 % (ref 11.0–15.0)
WBC: 6.2 10*3/uL (ref 3.8–10.8)

## 2016-07-21 LAB — T-HELPER CELL (CD4) - (RCID CLINIC ONLY)
CD4 % Helper T Cell: 25 % — ABNORMAL LOW (ref 33–55)
CD4 T Cell Abs: 610 /uL (ref 400–2700)

## 2016-07-23 LAB — HIV RNA, RTPCR W/R GT (RTI, PI,INT)
HIV-1 RNA, QN PCR: NOT DETECTED {Log_copies}/mL
HIV-1 RNA, QN PCR: <20 copies/mL

## 2016-08-03 ENCOUNTER — Ambulatory Visit: Payer: Medicare Other | Admitting: Infectious Disease

## 2016-08-24 ENCOUNTER — Telehealth: Payer: Medicare Other | Admitting: Family

## 2016-08-24 DIAGNOSIS — J208 Acute bronchitis due to other specified organisms: Secondary | ICD-10-CM

## 2016-08-24 DIAGNOSIS — B9689 Other specified bacterial agents as the cause of diseases classified elsewhere: Secondary | ICD-10-CM

## 2016-08-24 MED ORDER — AZITHROMYCIN 250 MG PO TABS: ORAL_TABLET | ORAL | 0 refills | Status: DC

## 2016-08-24 NOTE — Progress Notes (Signed)
We are sorry that you are not feeling well.  Here is how we plan to help!  Based on your presentation I believe you most likely have A cough due to bacteria.  When patients have a fever and a productive cough with a change in color or increased sputum production, we are concerned about bacterial bronchitis.  If left untreated it can progress to pneumonia.  If your symptoms do not improve with your treatment plan it is important that you contact your provider.   I have prescribed Azithromyin 250 mg: two tables now and then one tablet daily for 4 additonal days    In addition you may use A non-prescription cough medication called Mucinex DM: take 2 tablets every 12 hours.    From your responses in the eVisit questionnaire you describe inflammation in the upper respiratory tract which is causing a significant cough.  This is commonly called Bronchitis and has four common causes:    Allergies  Viral Infections  Acid Reflux  Bacterial Infection Allergies, viruses and acid reflux are treated by controlling symptoms or eliminating the cause. An example might be a cough caused by taking certain blood pressure medications. You stop the cough by changing the medication. Another example might be a cough caused by acid reflux. Controlling the reflux helps control the cough.  USE OF BRONCHODILATOR ("RESCUE") INHALERS: There is a risk from using your bronchodilator too frequently.  The risk is that over-reliance on a medication which only relaxes the muscles surrounding the breathing tubes can reduce the effectiveness of medications prescribed to reduce swelling and congestion of the tubes themselves.  Although you feel brief relief from the bronchodilator inhaler, your asthma may actually be worsening with the tubes becoming more swollen and filled with mucus.  This can delay other crucial treatments, such as oral steroid medications. If you need to use a bronchodilator inhaler daily, several times per day,  you should discuss this with your provider.  There are probably better treatments that could be used to keep your asthma under control.     HOME CARE . Only take medications as instructed by your medical team. . Complete the entire course of an antibiotic. . Drink plenty of fluids and get plenty of rest. . Avoid close contacts especially the very young and the elderly . Cover your mouth if you cough or cough into your sleeve. . Always remember to wash your hands . A steam or ultrasonic humidifier can help congestion.   GET HELP RIGHT AWAY IF: . You develop worsening fever. . You become short of breath . You cough up blood. . Your symptoms persist after you have completed your treatment plan MAKE SURE YOU   Understand these instructions.  Will watch your condition.  Will get help right away if you are not doing well or get worse.  Your e-visit answers were reviewed by a board certified advanced clinical practitioner to complete your personal care plan.  Depending on the condition, your plan could have included both over the counter or prescription medications. If there is a problem please reply  once you have received a response from your provider. Your safety is important to us.  If you have drug allergies check your prescription carefully.    You can use MyChart to ask questions about today's visit, request a non-urgent call back, or ask for a work or school excuse for 24 hours related to this e-Visit. If it has been greater than 24 hours you will need   to follow up with your provider, or enter a new e-Visit to address those concerns. You will get an e-mail in the next two days asking about your experience.  I hope that your e-visit has been valuable and will speed your recovery. Thank you for using e-visits.   

## 2016-08-26 ENCOUNTER — Encounter: Payer: Self-pay | Admitting: Infectious Disease

## 2016-08-28 ENCOUNTER — Other Ambulatory Visit: Payer: Self-pay | Admitting: Family

## 2016-08-28 ENCOUNTER — Telehealth: Payer: Self-pay | Admitting: *Deleted

## 2016-08-28 NOTE — Telephone Encounter (Signed)
Patient called stating he is somewhat better on the z-pack that was prescribed on an e-visit. He said he is still not "feeling all the way better". He is requesting a second Rx for a z-pack. Advised patient that the antibiotic continues to work after the five days and he should give it a chance. Suggested he do another e-visit if he still has concerns. He is also requesting Chantix for help in quitting smoking.

## 2016-08-28 NOTE — Telephone Encounter (Signed)
He can definitely have chantix but yest should wait. Esp if this is viral it will take time and abx wil not make a difference

## 2016-08-31 ENCOUNTER — Other Ambulatory Visit: Payer: Self-pay | Admitting: *Deleted

## 2016-08-31 DIAGNOSIS — Z716 Tobacco abuse counseling: Secondary | ICD-10-CM

## 2016-08-31 MED ORDER — VARENICLINE TARTRATE 0.5 MG PO TABS: 0.5000 mg | ORAL_TABLET | Freq: Two times a day (BID) | ORAL | Status: DC

## 2016-08-31 MED ORDER — VARENICLINE TARTRATE 0.5 MG PO TABS: 0.5000 mg | ORAL_TABLET | Freq: Two times a day (BID) | ORAL | 0 refills | Status: DC

## 2016-08-31 MED ORDER — VARENICLINE TARTRATE 1 MG PO TABS: 1.0000 mg | ORAL_TABLET | Freq: Two times a day (BID) | ORAL | 1 refills | Status: DC

## 2016-08-31 NOTE — Telephone Encounter (Signed)
Rx sent to patient pharmacy and he has been notified.

## 2016-09-02 ENCOUNTER — Other Ambulatory Visit: Payer: Self-pay | Admitting: *Deleted

## 2016-09-02 DIAGNOSIS — Z716 Tobacco abuse counseling: Secondary | ICD-10-CM

## 2016-09-02 DIAGNOSIS — Z720 Tobacco use: Secondary | ICD-10-CM

## 2016-09-02 MED ORDER — VARENICLINE TARTRATE 1 MG PO TABS: 1.0000 mg | ORAL_TABLET | Freq: Two times a day (BID) | ORAL | 1 refills | Status: DC

## 2016-09-09 ENCOUNTER — Telehealth: Payer: Self-pay | Admitting: *Deleted

## 2016-09-09 NOTE — Telephone Encounter (Signed)
Patient requesting refill of chantix, but this requires prior authorization. Initiated on [email or web address]. Insurance suggests first trying/failing Bupropion HCl ER, Nicotine, or Nicotine Polacrilex. Please advise. Landis Gandy, RN

## 2016-09-10 ENCOUNTER — Other Ambulatory Visit: Payer: Self-pay | Admitting: Internal Medicine

## 2016-09-11 NOTE — Telephone Encounter (Signed)
If chantix worked for him then I would prefer chantix

## 2016-09-15 NOTE — Telephone Encounter (Signed)
Completed and sent PA for Chantix via [email or web address]

## 2016-09-16 NOTE — Telephone Encounter (Signed)
Chantix approved, Authorization [id number]. Patient's preferred pharmacy notified.  Landis Gandy, RN

## 2016-10-01 ENCOUNTER — Ambulatory Visit (INDEPENDENT_AMBULATORY_CARE_PROVIDER_SITE_OTHER): Payer: Medicare Other | Admitting: Infectious Disease

## 2016-10-01 ENCOUNTER — Encounter: Payer: Self-pay | Admitting: Infectious Disease

## 2016-10-01 VITALS — BP 120/83 | HR 80 | Wt 197.0 lb

## 2016-10-01 DIAGNOSIS — B20 Human immunodeficiency virus [HIV] disease: Secondary | ICD-10-CM | POA: Diagnosis not present

## 2016-10-01 DIAGNOSIS — Z789 Other specified health status: Secondary | ICD-10-CM | POA: Diagnosis not present

## 2016-10-01 DIAGNOSIS — Z720 Tobacco use: Secondary | ICD-10-CM

## 2016-10-01 DIAGNOSIS — Z113 Encounter for screening for infections with a predominantly sexual mode of transmission: Secondary | ICD-10-CM

## 2016-10-01 DIAGNOSIS — F341 Dysthymic disorder: Secondary | ICD-10-CM

## 2016-10-01 DIAGNOSIS — Z79899 Other long term (current) drug therapy: Secondary | ICD-10-CM

## 2016-10-01 NOTE — Progress Notes (Signed)
Subjective:   Chief complaint: follow-up for HIV   Patient ID: Darrell Schwartz, male    DOB: [birth year], 46 y.o.   MRN: [id number]  HPI  [name] is a 46 y.o. male with HIV disease  who had been doing superbly well on his antiviral regimen, of Evotaz and truvada --> Descovy undetectable viral load and health cd4 count.   He states that in past had been on Combivir and Sustiva which he could not tolerate due to"nightmares."  He had been initially cared for in [place], [place].   We changed him to EVOTAZ and Truvada --> Evotaz and Descovy  He has been taking Chantix and has cut down his cigarettes to 3 per day. He did not want a flu shot today because he claimed that it made him feel poorly for 6 weeks after he had his last few flu shots. I again strongly recommended administration of this vaccine and told him that such symptoms just showed that his immune system was responding well to the vaccine.  Lab Results  Component Value Date   HIV1RNAQUANT <20 02/18/2015   HIV1RNAQUANT <20 09/17/2014   HIV1RNAQUANT <20 03/13/2014   Lab Results  Component Value Date   CD4TABS 610 07/20/2016   CD4TABS 640 02/10/2016   CD4TABS 450 02/18/2015    Past Medical History:  Diagnosis Date  . Allergy   . Anxiety   . Decreased visual acuity 09/17/2014  . Encounter for long-term (current) use of medications 02/10/2016  . Hepatitis B immune 02/10/2016  . HIV infection ([place])   . Hypertension   . Routine screening for STI (sexually transmitted infection) 02/10/2016  . Stroke [facility])     No past surgical history on file.  Family History  Problem Relation Age of Onset  . Heart disease Mother   . Heart disease Father       Social History   Social History  . Marital status: Married    Spouse name: N/A  . Number of children: N/A  . Years of education: N/A   Social History Main Topics  . Smoking status: Former Smoker    Packs/day: 0.25    Years: 17.00    Types: Cigarettes    Start  date: 07/19/2013  . Smokeless tobacco: Never Used     Comment: using electronic cigarettes  . Alcohol use 1.0 oz/week    2 drink(s) per week     Comment: rarely  . Drug use: No  . Sexual activity: Yes     Comment: pt. given condoms   Other Topics Concern  . Not on file   Social History Narrative  . No narrative on file    No Known Allergies   Current Outpatient Prescriptions:  .  ALPRAZolam (XANAX) 1 MG tablet, Take 1 tablet (1 mg total) by mouth 4 (four) times daily as needed., Disp: 30 tablet, Rfl: 1 .  azithromycin (ZITHROMAX) 250 MG tablet, zpak As directed, Disp: 6 tablet, Rfl: 0 .  DESCOVY 200-25 MG tablet, TAKE 1 TABLET BY MOUTH EVERY DAY, Disp: 30 tablet, Rfl: 5 .  EVOTAZ 300-150 MG tablet, TAKE 1 TABLET BY MOUTH EVERY DAY WITH FOOD, SWALLOW WHOLE, DO NOT CRUSH, CUT OR CHEW, Disp: 30 tablet, Rfl: 5 .  sertraline (ZOLOFT) 50 MG tablet, , Disp: , Rfl:  .  varenicline (CHANTIX CONTINUING MONTH PAK) 1 MG tablet, Take 1 tablet (1 mg total) by mouth 2 (two) times daily., Disp: 60 tablet, Rfl: 1 .  varenicline (CHANTIX) 0.5  MG tablet, Take 1 tablet (0.5 mg total) by mouth 2 (two) times daily., Disp: 60 tablet, Rfl: 0    hReview of Systems  Constitutional: Negative for activity change, appetite change, chills, diaphoresis and unexpected weight change.  HENT: Negative for sinus pressure, sneezing and trouble swallowing.   Eyes: Negative for photophobia and visual disturbance.  Respiratory: Negative for chest tightness, wheezing and stridor.   Cardiovascular: Negative for chest pain, palpitations and leg swelling.  Gastrointestinal: Negative for abdominal distention, abdominal pain, anal bleeding, blood in stool, constipation and nausea.  Genitourinary: Negative for difficulty urinating, dysuria, flank pain and hematuria.  Musculoskeletal: Negative for arthralgias, back pain, gait problem, joint swelling and myalgias.  Skin: Negative for color change, pallor and wound.   Neurological: Negative for dizziness, tremors, weakness and light-headedness.  Hematological: Negative for adenopathy. Does not bruise/bleed easily.  Psychiatric/Behavioral: Negative for agitation, behavioral problems, confusion, decreased concentration, dysphoric mood and sleep disturbance.       Objective:   Physical Exam  Constitutional: He is oriented to person, place, and time. He appears well-developed and well-nourished. No distress.  HENT:  Head: Normocephalic and atraumatic.  Mouth/Throat: Oropharynx is clear and moist. No oropharyngeal exudate.  Eyes: Conjunctivae and EOM are normal. No scleral icterus.  Neck: Normal range of motion. Neck supple. No JVD present.  Cardiovascular: Normal rate, regular rhythm and normal heart sounds.   Pulmonary/Chest: Effort normal. No respiratory distress. He has no wheezes.  Abdominal: He exhibits no distension. There is no tenderness.  Musculoskeletal: He exhibits no edema or tenderness.  Lymphadenopathy:    He has no cervical adenopathy.  Neurological: He is alert and oriented to person, place, and time. He exhibits normal muscle tone. Coordination normal.  Skin: Skin is warm and dry. He is not diaphoretic. No erythema. No pallor.  Psychiatric: He has a normal mood and affect. His behavior is normal. Judgment and thought content normal.  Nursing note and vitals reviewed.         Assessment & Plan:  HIV: continue EVOTAZ and Descovy, RTC in  One year. We can consider SYMTUZA if he would like STR  Smoking: cut down to 3 cigarettes and applauded him

## 2016-10-23 ENCOUNTER — Other Ambulatory Visit: Payer: Self-pay | Admitting: Infectious Disease

## 2016-10-23 DIAGNOSIS — B20 Human immunodeficiency virus [HIV] disease: Secondary | ICD-10-CM

## 2017-02-22 ENCOUNTER — Other Ambulatory Visit: Payer: Medicare Other

## 2017-02-22 DIAGNOSIS — Z789 Other specified health status: Secondary | ICD-10-CM

## 2017-02-22 DIAGNOSIS — Z79899 Other long term (current) drug therapy: Secondary | ICD-10-CM

## 2017-02-22 DIAGNOSIS — B20 Human immunodeficiency virus [HIV] disease: Secondary | ICD-10-CM

## 2017-02-22 DIAGNOSIS — Z113 Encounter for screening for infections with a predominantly sexual mode of transmission: Secondary | ICD-10-CM

## 2017-02-22 DIAGNOSIS — F341 Dysthymic disorder: Secondary | ICD-10-CM

## 2017-02-23 ENCOUNTER — Encounter: Payer: Self-pay | Admitting: Infectious Disease

## 2017-02-23 LAB — LIPID PANEL
CHOLESTEROL: 214 mg/dL — AB (ref <200)
HDL: 38 mg/dL — ABNORMAL LOW (ref >40)
LDL Cholesterol (Calc): 145 mg/dL (calc) — ABNORMAL HIGH
Non-HDL Cholesterol (Calc): 176 mg/dL (calc) — ABNORMAL HIGH (ref <130)
Total CHOL/HDL Ratio: 5.6 (calc) — ABNORMAL HIGH (ref <5.0)
Triglycerides: 177 mg/dL — ABNORMAL HIGH (ref <150)

## 2017-02-23 LAB — COMPLETE METABOLIC PANEL WITH GFR
AG Ratio: 1.3 (calc) (ref 1.0–2.5)
ALBUMIN MSPROF: 4.3 g/dL (ref 3.6–5.1)
ALT: 24 U/L (ref 9–46)
AST: 20 U/L (ref 10–40)
Alkaline phosphatase (APISO): 105 U/L (ref 40–115)
BUN: 11 mg/dL (ref 7–25)
CALCIUM: 9.3 mg/dL (ref 8.6–10.3)
CO2: 27 mmol/L (ref 20–32)
CREATININE: 1.01 mg/dL (ref 0.60–1.35)
Chloride: 101 mmol/L (ref 98–110)
GFR, EST NON AFRICAN AMERICAN: 89 mL/min/{1.73_m2} (ref >=60)
GFR, Est African American: 104 mL/min/{1.73_m2} (ref >=60)
GLOBULIN: 3.2 g/dL (ref 1.9–3.7)
Glucose, Bld: 100 mg/dL — ABNORMAL HIGH (ref 65–99)
Potassium: 4.1 mmol/L (ref 3.5–5.3)
SODIUM: 137 mmol/L (ref 135–146)
Total Bilirubin: 4 mg/dL — ABNORMAL HIGH (ref 0.2–1.2)
Total Protein: 7.5 g/dL (ref 6.1–8.1)

## 2017-02-23 LAB — CBC WITH DIFFERENTIAL/PLATELET
BASOS ABS: 33 {cells}/uL (ref 0–200)
Basophils Relative: 0.5 %
EOS PCT: 1.2 %
Eosinophils Absolute: 78 cells/uL (ref 15–500)
HCT: 47.5 % (ref 38.5–50.0)
Hemoglobin: 16.4 g/dL (ref 13.2–17.1)
LYMPHS ABS: 1931 {cells}/uL (ref 850–3900)
MCH: 32.6 pg (ref 27.0–33.0)
MCHC: 34.5 g/dL (ref 32.0–36.0)
MCV: 94.4 fL (ref 80.0–100.0)
MONOS PCT: 7.4 %
MPV: 10.3 fL (ref 7.5–12.5)
NEUTROS PCT: 61.2 %
Neutro Abs: 3978 cells/uL (ref 1500–7800)
PLATELETS: 178 10*3/uL (ref 140–400)
RBC: 5.03 10*6/uL (ref 4.20–5.80)
RDW: 11.9 % (ref 11.0–15.0)
TOTAL LYMPHOCYTE: 29.7 %
WBC mixed population: 481 cells/uL (ref 200–950)
WBC: 6.5 10*3/uL (ref 3.8–10.8)

## 2017-02-23 LAB — T-HELPER CELL (CD4) - (RCID CLINIC ONLY)
CD4 % Helper T Cell: 24 % — ABNORMAL LOW (ref 33–55)
CD4 T Cell Abs: 520 /uL (ref 400–2700)

## 2017-02-23 LAB — RPR: RPR Ser Ql: NONREACTIVE

## 2017-02-24 LAB — HIV-1 RNA QUANT-NO REFLEX-BLD
HIV 1 RNA QUANT: DETECTED {copies}/mL — AB
HIV-1 RNA QUANT, LOG: DETECTED {Log_copies}/mL — AB

## 2017-03-15 ENCOUNTER — Encounter: Payer: Self-pay | Admitting: Infectious Disease

## 2017-03-15 ENCOUNTER — Ambulatory Visit (INDEPENDENT_AMBULATORY_CARE_PROVIDER_SITE_OTHER): Payer: Medicare Other | Admitting: Infectious Disease

## 2017-03-15 VITALS — BP 135/86 | HR 76 | Temp 98.1°F | Ht 70.0 in | Wt 197.0 lb

## 2017-03-15 DIAGNOSIS — Z113 Encounter for screening for infections with a predominantly sexual mode of transmission: Secondary | ICD-10-CM | POA: Diagnosis not present

## 2017-03-15 DIAGNOSIS — G4700 Insomnia, unspecified: Secondary | ICD-10-CM | POA: Diagnosis not present

## 2017-03-15 DIAGNOSIS — Z23 Encounter for immunization: Secondary | ICD-10-CM

## 2017-03-15 DIAGNOSIS — B20 Human immunodeficiency virus [HIV] disease: Secondary | ICD-10-CM

## 2017-03-15 DIAGNOSIS — Z79899 Other long term (current) drug therapy: Secondary | ICD-10-CM | POA: Diagnosis not present

## 2017-03-15 DIAGNOSIS — K429 Umbilical hernia without obstruction or gangrene: Secondary | ICD-10-CM | POA: Diagnosis not present

## 2017-03-15 HISTORY — DX: Umbilical hernia without obstruction or gangrene: K42.9

## 2017-03-15 NOTE — Progress Notes (Signed)
Subjective:   Chief complaint: umbilical hernia  Patient ID: Darrell Schwartz, male    DOB: 01/06/1971, 47 y.o.   MRN: [id number]  HPI  Darrell Schwartz is a 47 y.o. male with HIV disease  who had been doing superbly well on his antiviral regimen, of Evotaz and truvada --> Descovy undetectable viral load and health cd4 count.   He states that in past had been on Combivir and Sustiva which he could not tolerate due to"nightmares."  He had been initially cared for in [place], [place].   We changed him to EVOTAZ and Truvada --> Evotaz and Descovy .  Lab Results  Component Value Date   HIV1RNAQUANT <20 DETECTED (A) 02/22/2017   HIV1RNAQUANT <20 02/18/2015   HIV1RNAQUANT <20 09/17/2014   Lab Results  Component Value Date   CD4TABS 520 02/22/2017   CD4TABS 610 07/20/2016   CD4TABS 640 02/10/2016    He has successfully quit smoking  He does have problems with his thoughts racing at night which has not responded to taking OTC melatonin.  He also has painful hernia in umbilicus that is not relieved by NSAIDS     Past Medical History:  Diagnosis Date  . Allergy   . Anxiety   . Decreased visual acuity 09/17/2014  . Encounter for long-term (current) use of medications 02/10/2016  . Hepatitis B immune 02/10/2016  . Hypertension   . Routine screening for STI (sexually transmitted infection) 02/10/2016  . Stroke [facility])     No past surgical history on file.  Family History  Problem Relation Age of Onset  . Heart disease Mother   . Heart disease Father       Social History   Socioeconomic History  . Marital status: Married    Spouse name: None  . Number of children: None  . Years of education: None  . Highest education level: None  Social Needs  . Financial resource strain: None  . Food insecurity - worry: None  . Food insecurity - inability: None  . Transportation needs - medical: None  . Transportation needs - non-medical: None  Occupational History  . None  Tobacco  Use  . Smoking status: Former Smoker    Packs/day: 0.25    Years: 17.00    Pack years: 4.25    Types: Cigarettes    Start date: 07/19/2013  . Smokeless tobacco: Never Used  . Tobacco comment: using electronic cigarettes  Substance and Sexual Activity  . Alcohol use: Yes    Alcohol/week: 1.0 oz    Types: 2 drink(s) per week    Comment: rarely  . Drug use: No  . Sexual activity: Yes    Comment: pt. given condoms  Other Topics Concern  . None  Social History Narrative  . None    No Known Allergies   Current Outpatient Medications:  .  DESCOVY 200-25 MG tablet, TAKE 1 TABLET BY MOUTH EVERY DAY, Disp: 30 tablet, Rfl: 5 .  EVOTAZ 300-150 MG tablet, TAKE 1 TABLET BY MOUTH EVERY DAY WITH FOOD, SWALLOW WHOLE, DO NOT CRUSH, CUT OR CHEW, Disp: 30 tablet, Rfl: 5 .  ALPRAZolam (XANAX) 1 MG tablet, Take 1 tablet (1 mg total) by mouth 4 (four) times daily as needed. (Patient not taking: Reported on 03/15/2017), Disp: 30 tablet, Rfl: 1 .  azithromycin (ZITHROMAX) 250 MG tablet, zpak As directed (Patient not taking: Reported on 03/15/2017), Disp: 6 tablet, Rfl: 0 .  sertraline (ZOLOFT) 50 MG tablet, , Disp: , Rfl:  .  varenicline (CHANTIX CONTINUING MONTH PAK) 1 MG tablet, Take 1 tablet (1 mg total) by mouth 2 (two) times daily. (Patient not taking: Reported on 03/15/2017), Disp: 60 tablet, Rfl: 1 .  varenicline (CHANTIX) 0.5 MG tablet, Take 1 tablet (0.5 mg total) by mouth 2 (two) times daily. (Patient not taking: Reported on 03/15/2017), Disp: 60 tablet, Rfl: 0    hReview of Systems  Constitutional: Negative for activity change, appetite change, chills, diaphoresis and unexpected weight change.  HENT: Negative for sinus pressure, sneezing and trouble swallowing.   Eyes: Negative for photophobia and visual disturbance.  Respiratory: Negative for chest tightness, wheezing and stridor.   Cardiovascular: Negative for chest pain, palpitations and leg swelling.  Gastrointestinal: Positive for  abdominal pain. Negative for abdominal distention, anal bleeding, blood in stool, constipation and nausea.  Genitourinary: Negative for difficulty urinating, dysuria, flank pain and hematuria.  Musculoskeletal: Negative for arthralgias, back pain, gait problem, joint swelling and myalgias.  Skin: Negative for color change, pallor and wound.  Neurological: Negative for dizziness, tremors, weakness and light-headedness.  Hematological: Negative for adenopathy. Does not bruise/bleed easily.  Psychiatric/Behavioral: Positive for sleep disturbance. Negative for agitation, behavioral problems, confusion, decreased concentration and dysphoric mood. The patient is nervous/anxious.        Objective:   Physical Exam  Constitutional: He is oriented to person, place, and time. He appears well-developed and well-nourished. No distress.  HENT:  Head: Normocephalic and atraumatic.  Mouth/Throat: Oropharynx is clear and moist. No oropharyngeal exudate.  Eyes: Conjunctivae and EOM are normal. No scleral icterus.  Neck: Normal range of motion. Neck supple. No JVD present.  Cardiovascular: Normal rate, regular rhythm and normal heart sounds.  Pulmonary/Chest: Effort normal. No respiratory distress. He has no wheezes.  Abdominal: He exhibits no distension. There is tenderness in the periumbilical area.    Musculoskeletal: He exhibits no edema or tenderness.  Lymphadenopathy:    He has no cervical adenopathy.  Neurological: He is alert and oriented to person, place, and time. He exhibits normal muscle tone. Coordination normal.  Skin: Skin is warm and dry. He is not diaphoretic. No erythema. No pallor.  Psychiatric: He has a normal mood and affect. His behavior is normal. Judgment and thought content normal.  Nursing note and vitals reviewed.         Assessment & Plan:  HIV: continue EVOTAZ and Descovy RTC for SPAP renewal since Tira not on SPAP formularly staying with 2 pills for now  Smoking:  has stopped  New hernia: referring to [facility] for management  Insomnia and mind racing, anxiety: this is worse and not responded to melatonin. I suggested meditation using an app on his smart phone. I also suggested OTC benadryl vs rx for trazadone but he did not want the latter  Need for flu vaccine.  I spent greater than 25 minutes with the patient including greater than 50% of time in face to face counsel of the patient re [name] virus and how we manage this, meaning of <20 but detectable (not clinically significant), commending him on smoking, also with trying to convince him of benefits to himself and other sof flu vaccination  and in coordination of  His care.

## 2017-05-05 ENCOUNTER — Other Ambulatory Visit: Payer: Self-pay | Admitting: Infectious Disease

## 2017-05-05 DIAGNOSIS — B20 Human immunodeficiency virus [HIV] disease: Secondary | ICD-10-CM

## 2017-07-30 ENCOUNTER — Other Ambulatory Visit: Payer: Medicare Other

## 2017-08-13 ENCOUNTER — Encounter: Payer: Medicare Other | Admitting: Infectious Disease

## 2017-08-19 ENCOUNTER — Other Ambulatory Visit: Payer: Medicare Other

## 2017-08-19 DIAGNOSIS — Z79899 Other long term (current) drug therapy: Secondary | ICD-10-CM

## 2017-08-19 DIAGNOSIS — Z113 Encounter for screening for infections with a predominantly sexual mode of transmission: Secondary | ICD-10-CM

## 2017-08-19 DIAGNOSIS — B20 Human immunodeficiency virus [HIV] disease: Secondary | ICD-10-CM

## 2017-08-20 ENCOUNTER — Other Ambulatory Visit: Payer: Medicare Other

## 2017-08-20 LAB — LIPID PANEL
CHOL/HDL RATIO: 4.8 (calc) (ref <5.0)
Cholesterol: 187 mg/dL (ref <200)
HDL: 39 mg/dL — AB (ref >40)
LDL Cholesterol (Calc): 111 mg/dL (calc) — ABNORMAL HIGH
NON-HDL CHOLESTEROL (CALC): 148 mg/dL — AB (ref <130)
Triglycerides: 258 mg/dL — ABNORMAL HIGH (ref <150)

## 2017-08-20 LAB — CBC WITH DIFFERENTIAL/PLATELET
BASOS ABS: 32 {cells}/uL (ref 0–200)
Basophils Relative: 0.5 %
EOS PCT: 1.9 %
Eosinophils Absolute: 122 cells/uL (ref 15–500)
HEMATOCRIT: 43.7 % (ref 38.5–50.0)
HEMOGLOBIN: 15 g/dL (ref 13.2–17.1)
LYMPHS ABS: 2106 {cells}/uL (ref 850–3900)
MCH: 33.1 pg — ABNORMAL HIGH (ref 27.0–33.0)
MCHC: 34.3 g/dL (ref 32.0–36.0)
MCV: 96.5 fL (ref 80.0–100.0)
MPV: 10.5 fL (ref 7.5–12.5)
Monocytes Relative: 7.7 %
NEUTROS ABS: 3648 {cells}/uL (ref 1500–7800)
Neutrophils Relative %: 57 %
Platelets: 149 10*3/uL (ref 140–400)
RBC: 4.53 10*6/uL (ref 4.20–5.80)
RDW: 12.1 % (ref 11.0–15.0)
Total Lymphocyte: 32.9 %
WBC: 6.4 10*3/uL (ref 3.8–10.8)
WBCMIX: 493 {cells}/uL (ref 200–950)

## 2017-08-20 LAB — COMPLETE METABOLIC PANEL WITH GFR
AG RATIO: 1.5 (calc) (ref 1.0–2.5)
ALKALINE PHOSPHATASE (APISO): 114 U/L (ref 40–115)
ALT: 29 U/L (ref 9–46)
AST: 27 U/L (ref 10–40)
Albumin: 4.5 g/dL (ref 3.6–5.1)
BUN: 16 mg/dL (ref 7–25)
CHLORIDE: 107 mmol/L (ref 98–110)
CO2: 25 mmol/L (ref 20–32)
Calcium: 9.5 mg/dL (ref 8.6–10.3)
Creat: 1.11 mg/dL (ref 0.60–1.35)
GFR, Est African American: 92 mL/min/{1.73_m2} (ref >=60)
GFR, Est Non African American: 79 mL/min/{1.73_m2} (ref >=60)
Globulin: 3.1 g/dL (calc) (ref 1.9–3.7)
Glucose, Bld: 115 mg/dL — ABNORMAL HIGH (ref 65–99)
POTASSIUM: 4 mmol/L (ref 3.5–5.3)
Sodium: 142 mmol/L (ref 135–146)
Total Bilirubin: 3.9 mg/dL — ABNORMAL HIGH (ref 0.2–1.2)
Total Protein: 7.6 g/dL (ref 6.1–8.1)

## 2017-08-20 LAB — T-HELPER CELL (CD4) - (RCID CLINIC ONLY)
CD4 % Helper T Cell: 27 % — ABNORMAL LOW (ref 33–55)
CD4 T CELL ABS: 590 /uL (ref 400–2700)

## 2017-08-20 LAB — RPR: RPR: NONREACTIVE

## 2017-08-23 ENCOUNTER — Encounter: Payer: Self-pay | Admitting: Infectious Disease

## 2017-08-23 LAB — HIV-1 RNA QUANT-NO REFLEX-BLD
HIV 1 RNA Quant: <20 copies/mL
HIV-1 RNA QUANT, LOG: NOT DETECTED {Log_copies}/mL

## 2017-09-08 ENCOUNTER — Telehealth: Payer: Self-pay | Admitting: *Deleted

## 2017-09-08 DIAGNOSIS — B20 Human immunodeficiency virus [HIV] disease: Secondary | ICD-10-CM

## 2017-09-08 MED ORDER — ATAZANAVIR-COBICISTAT 300-150 MG PO TABS: ORAL_TABLET | ORAL | 2 refills | Status: DC

## 2017-09-08 MED ORDER — EMTRICITABINE-TENOFOVIR AF 200-25 MG PO TABS: 1.0000 | ORAL_TABLET | Freq: Every day | ORAL | 2 refills | Status: DC

## 2017-09-08 NOTE — Telephone Encounter (Signed)
Patient was contacted to reschedule his visit with Dr Tommy Medal by front desk. He asked to speak with a nurse. He was unhappy that 1) he had been rescheduled three times, 2) that he kept getting bills even though he has Medicare and SPAP, 3) would like to know if he can just come yearly, since he states he has always taken his medication - annual visits would help him keep costs low and he states he just "doesn't like coming there, being outed." Patient had labs 07/27/17, is undetectable. RN sent in 3 months of refills. RN relayed financial concern to Marena Chancy who was able to reverse some charges.  Please advise on annual visits. Landis Gandy, RN

## 2017-09-09 NOTE — Telephone Encounter (Signed)
Yes we can see him annually but ask him to just come in to do labs at Q 39months

## 2017-09-10 ENCOUNTER — Encounter: Payer: Medicare Other | Admitting: Infectious Disease

## 2017-09-10 NOTE — Telephone Encounter (Signed)
Spoke with patient, he is in agreement, scheduled for 03/02/18 and 03/16/18. Landis Gandy, RN

## 2018-02-15 ENCOUNTER — Other Ambulatory Visit: Payer: Self-pay | Admitting: Infectious Disease

## 2018-02-15 DIAGNOSIS — B20 Human immunodeficiency virus [HIV] disease: Secondary | ICD-10-CM

## 2018-02-22 ENCOUNTER — Telehealth: Payer: Medicare Other | Admitting: Physician Assistant

## 2018-02-22 DIAGNOSIS — J00 Acute nasopharyngitis [common cold]: Secondary | ICD-10-CM | POA: Diagnosis not present

## 2018-02-22 MED ORDER — BENZONATATE 100 MG PO CAPS: 100.0000 mg | ORAL_CAPSULE | Freq: Three times a day (TID) | ORAL | 0 refills | Status: DC

## 2018-02-22 MED ORDER — IPRATROPIUM BROMIDE 0.06 % NA SOLN: 2.0000 | Freq: Four times a day (QID) | NASAL | 12 refills | Status: DC

## 2018-02-22 NOTE — Progress Notes (Signed)
We are sorry you are not feeling well.  Here is how we plan to help!  Based on what you have shared with me, it looks like you may have a viral upper respiratory infection or a "common cold".  Colds are caused by a large number of viruses; however, rhinovirus is the most common cause.   Symptoms of the common cold vary from person to person, with common symptoms including sore throat, cough, and malaise.  A low-grade fever of 100.4 may present, but is often uncommon.  Symptoms vary however, and are closely related to a person's age or underlying illnesses.  The most common symptoms associated with the common cold are nasal discharge or congestion, cough, sneezing, headache and pressure in the ears and face.  Cold symptoms usually persist for about 3 to 10 days, but can last up to 2 weeks.  It is important to know that colds do not cause serious illness or complications in most cases.    The common cold is transmitted from person to person, with the most common method of transmission being a person's hands.  The virus is able to live on the skin and can infect other persons for up to 2 hours after direct contact.  Also, colds are transmitted when someone coughs or sneezes; thus, it is important to cover the mouth to reduce this risk.  To keep the spread of the common cold at [place], good hand hygiene is very important.  This is an infection that is most likely caused by a virus. There are no specific treatments for the common cold other than to help you with the symptoms until the infection runs its course.    For nasal congestion, you may use an oral decongestants such as Mucinex D or if you have glaucoma or high blood pressure use plain Mucinex.  Saline nasal spray or nasal drops can help and can safely be used as often as needed for congestion.  For your congestion, I have prescribed Ipratropium Bromide nasal spray 0.03% two sprays in each nostril 2-3 times a day  If you do not have a history of heart  disease, hypertension, diabetes or thyroid disease, prostate/bladder issues or glaucoma, you may also use Sudafed to treat nasal congestion.  It is highly recommended that you consult with a pharmacist or your primary care physician to ensure this medication is safe for you to take.     If you have a cough, you may use cough suppressants such as Delsym and Robitussin.  If you have glaucoma or high blood pressure, you can also use Coricidin HBP.   For cough I have prescribed for you A prescription cough medication called Tessalon Perles 100 mg. You may take 1-2 capsules every 8 hours as needed for cough  If you have a sore or scratchy throat, use a saltwater gargle-  to  teaspoon of salt dissolved in a 4-ounce to 8-ounce glass of warm water.  Gargle the solution for approximately 15-30 seconds and then spit.  It is important not to swallow the solution.  You can also use throat lozenges/cough drops and Chloraseptic spray to help with throat pain or discomfort.  Warm or cold liquids can also be helpful in relieving throat pain.  For headache, pain or general discomfort, you can use Ibuprofen or Tylenol as directed.   Some authorities believe that zinc sprays or the use of Echinacea may shorten the course of your symptoms.   HOME CARE Only take medications as instructed  by your medical team. Be sure to drink plenty of fluids. Water is fine as well as fruit juices, sodas and electrolyte beverages. You may want to stay away from caffeine or alcohol. If you are nauseated, try taking small sips of liquids. How do you know if you are getting enough fluid? Your urine should be a pale yellow or almost colorless. Get rest. Taking a steamy shower or using a humidifier may help nasal congestion and ease sore throat pain. You can place a towel over your head and breathe in the steam from hot water coming from a faucet. Using a saline nasal spray works much the same way. Cough drops, hard candies and sore throat  lozenges may ease your cough. Avoid close contacts especially the very young and the elderly Cover your mouth if you cough or sneeze Always remember to wash your hands.   GET HELP RIGHT AWAY IF: You develop worsening fever. If your symptoms do not improve within 10 days You develop yellow or green discharge from your nose over 3 days. You have coughing fits You develop a severe head ache or visual changes. You develop shortness of breath, difficulty breathing or start having chest pain  Your symptoms persist after you have completed your treatment plan  MAKE SURE YOU  Understand these instructions. Will watch your condition. Will get help right away if you are not doing well or get worse.  Your e-visit answers were reviewed by a board certified advanced clinical practitioner to complete your personal care plan. Depending upon the condition, your plan could have included both over the counter or prescription medications. Please review your pharmacy choice. If there is a problem, you may call our nursing hot line at and have the prescription routed to another pharmacy. Your safety is important to [place]. If you have drug allergies check your prescription carefully.   You can use MyChart to ask questions about today's visit, request a non-urgent call back, or ask for a work or school excuse for 24 hours related to this e-Visit. If it has been greater than 24 hours you will need to follow up with your provider, or enter a new e-Visit to address those concerns. You will get an e-mail in the next two days asking about your experience.  I hope that your e-visit has been valuable and will speed your recovery. Thank you for using e-visits.      ===View-only below this line===   ----- Message -----    From: Luther Bradley    Sent: 02/22/2018 10:10 AM EST      To: E-Visit Mailing List Subject: E-Visit Submission: Flu Like Symptoms  E-Visit Submission: Flu Like  Symptoms --------------------------------  Question: How long have you had flu like symptoms? Answer:   over two days  Question: Are you having any body aches or pain such as Answer:   None  Question: Do you have a cough or sore throat? Answer:   I have both a cough and a sore throat  Question: Are you in close contact with anyone who has similar symptoms ? Answer:   No  Question: Do you have a fever? Answer:   Yes, I have a low fever (lower than 101 degrees)  Question: Describe your sore throat: Answer:   Little swelling in glands. Normally they give me a zpack and things clear up just fine.  Question: How long have you had a sore throat? Answer:   2 days  Question: Do you have any  tenderness or swelling in your neck? Answer:   No  Question: Are you short of breath? Answer:   No  Question: Do you have constant pain in your chest or abdomen? Answer:   No  Question: Are you able to keep liquids down? Answer:   Yes  Question: Have you experienced a seizure or loss of consciousness? Answer:   No  Question: Do you have a headache? Answer:   Yes  Question: Is there a rash? Answer:   No  Question: Are you over the age of 36? Answer:   No  Question: Are you treated for any of the following conditions: Asthma, COPD, diabetes, renal failure on dialysis, AIDS, any neuromuscular disease that effects the clearing of secretions heart failure or heart disease? Answer:   No  Question: Have you received recent chemotherapy or are you taking a drug that may reduce your ability to fight infections for psoriasis, arthritis, or any other autoimmune condition? Answer:   No  Question: Do you have close contact with anyone who has been diagnosed with any of the conditions listed above? Answer:   No  Question: Are your symptoms severe enough that you think or someone has told you that you need to see a physician urgently? Answer:   No  Question: Are you allergic to Zanamivir or  Oseltamivir (Tamiflu)? Answer:   No  Question: Are there children in your family or household that are under the age of 26? Answer:   No  Question: Please list your medication allergies that you may have ? (If 'none' , please list as 'none') Answer:   None  Question: Please list any additional comments  Answer:   Dr normally gives me zpack for sinus cold/ chest cold. Dont think its the flu cause mainly in my head/sinuses.

## 2018-03-02 ENCOUNTER — Other Ambulatory Visit (HOSPITAL_COMMUNITY)
Admission: RE | Admit: 2018-03-02 | Discharge: 2018-03-02 | Disposition: A | Discharge status: Home / Self Care | Payer: Medicare Other | Source: Ambulatory Visit | Attending: Infectious Disease | Admitting: Infectious Disease

## 2018-03-02 ENCOUNTER — Ambulatory Visit: Payer: Medicare Other

## 2018-03-02 ENCOUNTER — Other Ambulatory Visit: Payer: Medicare Other

## 2018-03-02 DIAGNOSIS — F341 Dysthymic disorder: Secondary | ICD-10-CM

## 2018-03-02 DIAGNOSIS — B20 Human immunodeficiency virus [HIV] disease: Secondary | ICD-10-CM | POA: Diagnosis present

## 2018-03-02 DIAGNOSIS — Z789 Other specified health status: Secondary | ICD-10-CM | POA: Insufficient documentation

## 2018-03-02 DIAGNOSIS — Z79899 Other long term (current) drug therapy: Secondary | ICD-10-CM | POA: Diagnosis present

## 2018-03-02 DIAGNOSIS — Z113 Encounter for screening for infections with a predominantly sexual mode of transmission: Secondary | ICD-10-CM | POA: Diagnosis present

## 2018-03-02 NOTE — Addendum Note (Signed)
Addended byMeriel Pica F on: 03/02/2018 10:00 AM   Modules accepted: Orders

## 2018-03-02 NOTE — Addendum Note (Signed)
Addended by: Dolan Amen D on: 03/02/2018 11:36 AM   Modules accepted: Orders

## 2018-03-03 ENCOUNTER — Encounter: Payer: Self-pay | Admitting: Infectious Diseases

## 2018-03-03 LAB — URINE CYTOLOGY ANCILLARY ONLY
Chlamydia: NEGATIVE
Neisseria Gonorrhea: NEGATIVE

## 2018-03-03 LAB — MICROALBUMIN / CREATININE URINE RATIO: CREATININE, URINE: 40 mg/dL (ref 20–320)

## 2018-03-03 LAB — T-HELPER CELL (CD4) - (RCID CLINIC ONLY)
CD4 T CELL HELPER: 27 % — AB (ref 33–55)
CD4 T Cell Abs: 530 /uL (ref 400–2700)

## 2018-03-04 LAB — CBC WITH DIFFERENTIAL/PLATELET
Absolute Monocytes: 416 cells/uL (ref 200–950)
Basophils Absolute: 40 cells/uL (ref 0–200)
Basophils Relative: 0.7 %
Eosinophils Absolute: 120 cells/uL (ref 15–500)
Eosinophils Relative: 2.1 %
HCT: 44.7 % (ref 38.5–50.0)
HEMOGLOBIN: 15.8 g/dL (ref 13.2–17.1)
Lymphs Abs: 2063 cells/uL (ref 850–3900)
MCH: 33.9 pg — ABNORMAL HIGH (ref 27.0–33.0)
MCHC: 35.3 g/dL (ref 32.0–36.0)
MCV: 95.9 fL (ref 80.0–100.0)
MONOS PCT: 7.3 %
MPV: 10.5 fL (ref 7.5–12.5)
Neutro Abs: 3061 cells/uL (ref 1500–7800)
Neutrophils Relative %: 53.7 %
Platelets: 157 10*3/uL (ref 140–400)
RBC: 4.66 10*6/uL (ref 4.20–5.80)
RDW: 12.4 % (ref 11.0–15.0)
Total Lymphocyte: 36.2 %
WBC: 5.7 10*3/uL (ref 3.8–10.8)

## 2018-03-04 LAB — COMPLETE METABOLIC PANEL WITH GFR
AG RATIO: 1.4 (calc) (ref 1.0–2.5)
ALT: 22 U/L (ref 9–46)
AST: 19 U/L (ref 10–40)
Albumin: 4.1 g/dL (ref 3.6–5.1)
Alkaline phosphatase (APISO): 99 U/L (ref 36–130)
BUN: 13 mg/dL (ref 7–25)
CHLORIDE: 105 mmol/L (ref 98–110)
CO2: 27 mmol/L (ref 20–32)
Calcium: 9.2 mg/dL (ref 8.6–10.3)
Creat: 1.06 mg/dL (ref 0.60–1.35)
GFR, Est African American: 97 mL/min/{1.73_m2} (ref >=60)
GFR, Est Non African American: 84 mL/min/{1.73_m2} (ref >=60)
Globulin: 3 g/dL (calc) (ref 1.9–3.7)
Glucose, Bld: 79 mg/dL (ref 65–99)
POTASSIUM: 4.9 mmol/L (ref 3.5–5.3)
Sodium: 141 mmol/L (ref 135–146)
Total Bilirubin: 3 mg/dL — ABNORMAL HIGH (ref 0.2–1.2)
Total Protein: 7.1 g/dL (ref 6.1–8.1)

## 2018-03-04 LAB — HIV-1 RNA QUANT-NO REFLEX-BLD
HIV 1 RNA Quant: <20 copies/mL
HIV-1 RNA Quant, Log: <1.3 Log copies/mL

## 2018-03-15 NOTE — Progress Notes (Signed)
Name: Darrell Schwartz  DOB: 1970/06/07 MRN: [id number] PCP: Tresa Garter, MD    Patient Active Problem List   Diagnosis Date Noted  . Umbilical hernia 99/37/1696  . Routine screening for STI (sexually transmitted infection) 02/10/2016  . Hepatitis B immune 02/10/2016  . Encounter for smoking cessation counseling 02/10/2016  . Decreased visual acuity 09/17/2014  . Insomnia 04/22/2011  . Tobacco abuse 06/09/2010  . Injury of left shoulder 06/09/2010  . CONSTIPATION 02/18/2010  . 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 HIV disease ([place]) 02/14/2009  . PNEUMOCYSTIS PNEUMONIA 01/31/2009  . PENILE LESION 01/31/2009  . UNSPECIFIED THROMBOCYTOPENIA 01/31/2009  . ANXIETY DEPRESSION 01/31/2009  . SUBSTANCE ABUSE, MULTIPLE 01/31/2009  . ARTHRITIS 01/31/2009  . LOW BACK PAIN, CHRONIC 01/31/2009     Subjective:  CC:  HIV follow up care. Picked up smoking again would like to quit.   HPI: Darrell Schwartz is a 48 y.o. male with HIV disease.   He is currently maintained on Evotaz + Descovy. Last office visit with Dr. Tommy Medal in February 2019. He called in August and requested annual visits. VL in February of this year undetectable and CD4 530. He reports 100% adherence with his regimen and taking it as instructed. He is married and in monogamous relationship w/o new sexual partners.   In the interval since his last visit he has been well; no interval illnesses, ER/Hospitalizations. He has been under increased stress at home with a new grand-daughter that was born quite early and now needs surgery. She is finally out of NICU however and home thriving. During this time he picked up smoking again and would like to try Chantix to quit. He is currently smoking 7-8 cigarettes a day. He used Chantix in the past to quit and thinks this will do the trick again.   He received the seasonal flu shot already around Thanksgiving at local [facility].   Review of Systems  Constitutional:  Negative for chills, fever, malaise/fatigue and weight loss.  HENT: Negative for sore throat.        No dental problems  Respiratory: Negative for cough and sputum production.   Cardiovascular: Negative for chest pain and leg swelling.  Gastrointestinal: Negative for abdominal pain, diarrhea and vomiting.  Genitourinary: Negative for dysuria and flank pain.  Musculoskeletal: Negative for joint pain, myalgias and neck pain.  Skin: Negative for rash.  Neurological: Negative for dizziness, tingling and headaches.  Psychiatric/Behavioral: Negative for depression and substance abuse. The patient is not nervous/anxious and does not have insomnia.     Past Medical History:  Diagnosis Date  . Allergy   . Anxiety   . Decreased visual acuity 09/17/2014  . Encounter for long-term (current) use of medications 02/10/2016  . Hepatitis B immune 02/10/2016  . Hypertension   . Routine screening for STI (sexually transmitted infection) 02/10/2016  . Stroke ([place])   . Umbilical hernia 7/89/3810    Outpatient Medications Prior to Visit  Medication Sig Dispense Refill  . DESCOVY 200-25 MG tablet TAKE 1 TABLET BY MOUTH DAILY 30 tablet 2  . EVOTAZ 300-150 MG tablet TAKE 1 TABLET BY MOUTH EVERY DAY WITH FOOD. SWALLOW WHOLE. DO NOT CRUSH, CUT OR CHEW 30 tablet 2  . ALPRAZolam (XANAX) 1 MG tablet Take 1 tablet (1 mg total) by mouth 4 (four) times daily as needed. (Patient not taking: Reported on 03/15/2017) 30 tablet 1  . benzonatate (TESSALON) 100 MG capsule Take 1-2 capsules (100-200  mg total) by mouth 3 (three) times daily. (Patient not taking: Reported on 03/16/2018) 30 capsule 0  . ipratropium (ATROVENT) 0.06 % nasal spray Place 2 sprays into both nostrils 4 (four) times daily. (Patient not taking: Reported on 03/16/2018) 15 mL 12  . sertraline (ZOLOFT) 50 MG tablet     . varenicline (CHANTIX CONTINUING MONTH PAK) 1 MG tablet Take 1 tablet (1 mg total) by mouth 2 (two) times daily. (Patient not taking: Reported  on 03/15/2017) 60 tablet 1  . varenicline (CHANTIX) 0.5 MG tablet Take 1 tablet (0.5 mg total) by mouth 2 (two) times daily. (Patient not taking: Reported on 03/15/2017) 60 tablet 0   No facility-administered medications prior to visit.      No Known Allergies  Social History   Tobacco Use  . Smoking status: Former Smoker    Packs/day: 0.25    Years: 17.00    Pack years: 4.25    Types: Cigarettes    Start date: 07/19/2013  . Smokeless tobacco: Never Used  . Tobacco comment: using electronic cigarettes  Substance Use Topics  . Alcohol use: Yes    Alcohol/week: 2.0 standard drinks    Types: 2 drink(s) per week    Comment: rarely  . Drug use: No    Frequency: 1.0 times per week    Social History   Substance and Sexual Activity  Sexual Activity Yes   Comment: pt. given condoms     Objective:   Vitals:   03/16/18 0916  BP: 121/87  Pulse: 64  Temp: 98.4 F (36.9 C)  Weight: 204 lb (92.5 kg)   Body mass index is 29.27 kg/m.  Physical Exam Constitutional:      Appearance: He is well-developed.     Comments: Seated comfortably in chair during visit.   HENT:     Mouth/Throat:     Dentition: Normal dentition. No dental abscesses.  Cardiovascular:     Rate and Rhythm: Normal rate and regular rhythm.     Heart sounds: Normal heart sounds.  Pulmonary:     Effort: Pulmonary effort is normal.     Breath sounds: Normal breath sounds.  Abdominal:     General: There is no distension.     Palpations: Abdomen is soft.     Tenderness: There is no abdominal tenderness.  Lymphadenopathy:     Cervical: No cervical adenopathy.  Skin:    General: Skin is warm and dry.     Findings: No rash.  Neurological:     Mental Status: He is alert and oriented to person, place, and time.  Psychiatric:        Judgment: Judgment normal.     Comments: In good spirits today and engaged in care discussion.      Lab Results Lab Results  Component Value Date   WBC 5.7 03/02/2018    HGB 15.8 03/02/2018   HCT 44.7 03/02/2018   MCV 95.9 03/02/2018   PLT 157 03/02/2018    Lab Results  Component Value Date   CREATININE 1.06 03/02/2018   BUN 13 03/02/2018   NA 141 03/02/2018   K 4.9 03/02/2018   CL 105 03/02/2018   CO2 27 03/02/2018    Lab Results  Component Value Date   ALT 22 03/02/2018   AST 19 03/02/2018   ALKPHOS 106 07/20/2016   BILITOT 3.0 (H) 03/02/2018    Lab Results  Component Value Date   CHOL 187 08/19/2017   HDL 39 (L) 08/19/2017  LDLCALC 111 (H) 08/19/2017   TRIG 258 (H) 08/19/2017   CHOLHDL 4.8 08/19/2017   HIV 1 RNA Quant (copies/mL)  Date Value  03/02/2018 <20 NOT DETECTED  08/19/2017 <20 NOT DETECTED  02/22/2017 <20 DETECTED (A)   CD4 T Cell Abs (/uL)  Date Value  03/02/2018 530  08/19/2017 590  02/22/2017 520     Assessment & Plan:   Problem List Items Addressed This Visit      Unprioritized   Encounter for smoking cessation counseling    He in the past has had good results on Chantix an requesting this again to help him quit. Will send in starter/maintenane pack. Counseled on proper use.       Relevant Medications   varenicline (CHANTIX) 0.5 MG tablet   varenicline (CHANTIX CONTINUING MONTH PAK) 1 MG tablet   HIV disease ([place]) - Primary (Chronic)    He has been doing very well on [name] medications and has been under very good control for a long time. His lipid panel is under good control and liver/kidney function is stable. We will vaccinate for Prevnar today then he will be up to date on recommended vaccines for PLWH. Will switch him to [place] alone to consolidate into STR now that this is now on formulary. He has been counseled on medication use. I suspect he will have no trouble with transition.   He would like to continue yearly visits. He has dental care and PCP. I asked him to please update [place] if he has any new medications during interval between visits as well as if there is any changes in his health. Will have  him return in 1 year to see Dr. Tommy Medal - lab orders entered.        Relevant Medications   Darunavir-Cobicisctat-Emtricitabine-Tenofovir Alafenamide (SYMTUZA) 800-150-200-10 MG TABS   Other Relevant Orders   CBC   T-helper cell (CD4)- ([facility] clinic only)   Comprehensive metabolic panel   HIV-1 RNA quant-no reflex-bld   RPR      Janene Madeira, MSN, NP-C [facility] [place] Pager: [phone] Office: [phone]  03/16/18  12:08 PM

## 2018-03-16 ENCOUNTER — Ambulatory Visit (INDEPENDENT_AMBULATORY_CARE_PROVIDER_SITE_OTHER): Payer: Medicare Other | Admitting: Infectious Diseases

## 2018-03-16 ENCOUNTER — Encounter: Payer: Self-pay | Admitting: Infectious Diseases

## 2018-03-16 ENCOUNTER — Other Ambulatory Visit: Payer: Self-pay

## 2018-03-16 ENCOUNTER — Telehealth: Payer: Self-pay

## 2018-03-16 VITALS — BP 121/87 | HR 64 | Temp 98.4°F | Wt 204.0 lb

## 2018-03-16 DIAGNOSIS — Z23 Encounter for immunization: Secondary | ICD-10-CM | POA: Diagnosis not present

## 2018-03-16 DIAGNOSIS — B20 Human immunodeficiency virus [HIV] disease: Secondary | ICD-10-CM

## 2018-03-16 DIAGNOSIS — Z716 Tobacco abuse counseling: Secondary | ICD-10-CM

## 2018-03-16 MED ORDER — VARENICLINE TARTRATE 0.5 MG PO TABS: 0.5000 mg | ORAL_TABLET | Freq: Two times a day (BID) | ORAL | 0 refills | Status: DC

## 2018-03-16 MED ORDER — DARUN-COBIC-EMTRICIT-TENOFAF 800-150-200-10 MG PO TABS: 1.0000 | ORAL_TABLET | Freq: Every day | ORAL | 5 refills | Status: DC

## 2018-03-16 MED ORDER — VARENICLINE TARTRATE 1 MG PO TABS: 1.0000 mg | ORAL_TABLET | Freq: Two times a day (BID) | ORAL | 1 refills | Status: DC

## 2018-03-16 MED ORDER — DARUN-COBIC-EMTRICIT-TENOFAF 800-150-200-10 MG PO TABS: 1.0000 | ORAL_TABLET | Freq: Every day | ORAL | 12 refills | Status: DC

## 2018-03-16 NOTE — Telephone Encounter (Signed)
Patient called office today stating he would like his Saxman sent to [facility] in [place]. Prescription was resent to preferred pharmacy, previous prescription was canceled. Darrell Schwartz

## 2018-03-16 NOTE — Patient Instructions (Addendum)
Very nice to meet you! Your medication is working [employer] for you.   Will send in a new pill called Symtuza to take once a day with food. Once you get this new pill please stop taking your Evotaz and Descovy. You should not notice much difference with this switch as they are very similar. Please take the same time every day with food as you are now .   We gave you your Prevnar vaccine today - this completes your pneumonia vaccines until your 66th birthday.   Please come back in 1 year for appointment with Dr. Tommy Medal with labs prior to your visit.   Will send in Chantix starter month pack and maintenance month packs for you to quit smoking. No changes in how to take this, just like you did last time.

## 2018-03-16 NOTE — Assessment & Plan Note (Signed)
He in the past has had good results on Chantix an requesting this again to help him quit. Will send in starter/maintenane pack. Counseled on proper use.

## 2018-03-16 NOTE — Assessment & Plan Note (Signed)
He has been doing very well on [name] medications and has been under very good control for a long time. His lipid panel is under good control and liver/kidney function is stable. We will vaccinate for Prevnar today then he will be up to date on recommended vaccines for PLWH. Will switch him to [place] alone to consolidate into STR now that this is now on formulary. He has been counseled on medication use. I suspect he will have no trouble with transition.   He would like to continue yearly visits. He has dental care and PCP. I asked him to please update [place] if he has any new medications during interval between visits as well as if there is any changes in his health. Will have him return in 1 year to see Dr. Tommy Medal - lab orders entered.

## 2018-03-17 NOTE — Addendum Note (Signed)
Addended by: Gery Pray C on: 03/17/2018 08:00 AM   Modules accepted: Orders

## 2018-08-31 ENCOUNTER — Other Ambulatory Visit: Payer: Self-pay | Admitting: Infectious Diseases

## 2018-08-31 DIAGNOSIS — B20 Human immunodeficiency virus [HIV] disease: Secondary | ICD-10-CM

## 2018-09-29 ENCOUNTER — Ambulatory Visit: Payer: Medicare Other

## 2018-09-29 ENCOUNTER — Encounter: Payer: Self-pay | Admitting: Infectious Diseases

## 2018-09-29 ENCOUNTER — Other Ambulatory Visit: Payer: Self-pay

## 2019-01-04 ENCOUNTER — Ambulatory Visit: Payer: Medicare Other | Admitting: Infectious Diseases

## 2019-02-07 ENCOUNTER — Other Ambulatory Visit: Payer: Self-pay | Admitting: Infectious Diseases

## 2019-02-07 DIAGNOSIS — B20 Human immunodeficiency virus [HIV] disease: Secondary | ICD-10-CM

## 2019-03-09 ENCOUNTER — Other Ambulatory Visit: Payer: Self-pay

## 2019-03-09 DIAGNOSIS — B20 Human immunodeficiency virus [HIV] disease: Secondary | ICD-10-CM

## 2019-03-09 MED ORDER — SYMTUZA 800-150-200-10 MG PO TABS: 1.0000 | ORAL_TABLET | Freq: Every day | ORAL | 0 refills | Status: DC

## 2019-03-14 NOTE — Addendum Note (Signed)
Addended by: Dolan Amen D on: 03/14/2019 08:49 AM   Modules accepted: Orders

## 2019-03-17 ENCOUNTER — Other Ambulatory Visit: Payer: Medicare Other

## 2019-04-03 ENCOUNTER — Encounter: Payer: Medicare Other | Admitting: Infectious Diseases

## 2019-04-06 ENCOUNTER — Other Ambulatory Visit: Payer: Self-pay | Admitting: Infectious Diseases

## 2019-04-06 DIAGNOSIS — B20 Human immunodeficiency virus [HIV] disease: Secondary | ICD-10-CM

## 2019-04-10 ENCOUNTER — Other Ambulatory Visit: Payer: Self-pay

## 2019-04-10 ENCOUNTER — Other Ambulatory Visit: Payer: Self-pay | Admitting: Infectious Diseases

## 2019-04-10 DIAGNOSIS — B20 Human immunodeficiency virus [HIV] disease: Secondary | ICD-10-CM

## 2019-04-10 NOTE — Telephone Encounter (Signed)
[name] appointment with Dr. Tommy Medal in early April. Patient will come into office this week for labs. Patient states that transportation is a big factor to him coming to his appts. Understands he can call office a day before his appt to help schedule transportation.  Patient would also like to know if MD would prescribe some antidepressants for him. States that he is in the process of getting divorced and has been feeling down lately.  Darrell Schwartz

## 2019-04-10 NOTE — Telephone Encounter (Signed)
Patient called office today requesting refills on Biktarvy. States that pharmacy is unable to refill due to prescription being denied. Patient has not been in office since 03/16/18.  Patient is refusing to schedule appointment with office due to covid 19.  States it's his right to get medication without being seen. Informed patient that per office policy a patient must be seen within a year to confirm medication is working for patient and that his medical needs are being meet. Patient was able to agree with an appointment in April, but states depending on transportation he might or might not make it. Patient ended call before I could offer transportation. Denied missing any doses. Will forward message to NP to advise if okay to send 30 day supply to pharmacy. [place]

## 2019-04-13 ENCOUNTER — Other Ambulatory Visit: Payer: Medicare HMO

## 2019-05-01 ENCOUNTER — Ambulatory Visit: Payer: Medicare HMO | Admitting: Infectious Diseases

## 2019-05-01 ENCOUNTER — Ambulatory Visit: Payer: Medicare HMO | Admitting: Infectious Disease

## 2019-05-08 ENCOUNTER — Other Ambulatory Visit: Payer: Self-pay | Admitting: Infectious Diseases

## 2019-05-08 DIAGNOSIS — B20 Human immunodeficiency virus [HIV] disease: Secondary | ICD-10-CM

## 2019-05-09 ENCOUNTER — Telehealth: Payer: Self-pay

## 2019-05-09 NOTE — Telephone Encounter (Signed)
Attempted to reach out to patient regarding overdue appointment. Unable to reach patient with either number listed. [place]

## 2019-10-11 ENCOUNTER — Ambulatory Visit: Payer: Medicare HMO | Admitting: Pharmacist

## 2019-10-13 ENCOUNTER — Other Ambulatory Visit: Payer: Medicare HMO

## 2019-11-20 ENCOUNTER — Encounter: Payer: Medicare HMO | Admitting: Infectious Diseases

## 2019-11-24 ENCOUNTER — Other Ambulatory Visit: Payer: Self-pay

## 2019-11-24 DIAGNOSIS — B20 Human immunodeficiency virus [HIV] disease: Secondary | ICD-10-CM

## 2019-11-24 DIAGNOSIS — Z79899 Other long term (current) drug therapy: Secondary | ICD-10-CM

## 2019-11-24 DIAGNOSIS — Z113 Encounter for screening for infections with a predominantly sexual mode of transmission: Secondary | ICD-10-CM

## 2019-11-30 ENCOUNTER — Ambulatory Visit: Payer: Medicare Other

## 2019-11-30 ENCOUNTER — Other Ambulatory Visit: Payer: Self-pay

## 2019-11-30 ENCOUNTER — Other Ambulatory Visit: Payer: Medicare Other

## 2019-11-30 DIAGNOSIS — Z79899 Other long term (current) drug therapy: Secondary | ICD-10-CM

## 2019-11-30 DIAGNOSIS — B20 Human immunodeficiency virus [HIV] disease: Secondary | ICD-10-CM

## 2019-11-30 DIAGNOSIS — Z113 Encounter for screening for infections with a predominantly sexual mode of transmission: Secondary | ICD-10-CM

## 2019-12-01 ENCOUNTER — Encounter: Payer: Self-pay | Admitting: Infectious Disease

## 2019-12-01 LAB — T-HELPER CELL (CD4) - (RCID CLINIC ONLY)
CD4 % Helper T Cell: 11 % — ABNORMAL LOW (ref 33–65)
CD4 T Cell Abs: 154 /uL — ABNORMAL LOW (ref 400–1790)

## 2019-12-06 ENCOUNTER — Other Ambulatory Visit: Payer: Self-pay

## 2019-12-06 DIAGNOSIS — B20 Human immunodeficiency virus [HIV] disease: Secondary | ICD-10-CM

## 2019-12-15 LAB — CBC WITH DIFFERENTIAL/PLATELET
Absolute Monocytes: 373 cells/uL (ref 200–950)
Basophils Absolute: 29 cells/uL (ref 0–200)
Basophils Relative: 0.7 %
Eosinophils Absolute: 103 cells/uL (ref 15–500)
Eosinophils Relative: 2.5 %
HCT: 39.8 % (ref 38.5–50.0)
Hemoglobin: 13.7 g/dL (ref 13.2–17.1)
Lymphs Abs: 1718 cells/uL (ref 850–3900)
MCH: 32.2 pg (ref 27.0–33.0)
MCHC: 34.4 g/dL (ref 32.0–36.0)
MCV: 93.4 fL (ref 80.0–100.0)
MPV: 11.1 fL (ref 7.5–12.5)
Monocytes Relative: 9.1 %
Neutro Abs: 1878 cells/uL (ref 1500–7800)
Neutrophils Relative %: 45.8 %
Platelets: 67 10*3/uL — ABNORMAL LOW (ref 140–400)
RBC: 4.26 10*6/uL (ref 4.20–5.80)
RDW: 11.8 % (ref 11.0–15.0)
Total Lymphocyte: 41.9 %
WBC: 4.1 10*3/uL (ref 3.8–10.8)

## 2019-12-15 LAB — COMPLETE METABOLIC PANEL WITH GFR
AG Ratio: 1.3 (calc) (ref 1.0–2.5)
ALT: 57 U/L — ABNORMAL HIGH (ref 9–46)
AST: 59 U/L — ABNORMAL HIGH (ref 10–40)
Albumin: 4 g/dL (ref 3.6–5.1)
Alkaline phosphatase (APISO): 109 U/L (ref 36–130)
BUN: 14 mg/dL (ref 7–25)
CO2: 27 mmol/L (ref 20–32)
Calcium: 8.6 mg/dL (ref 8.6–10.3)
Chloride: 105 mmol/L (ref 98–110)
Creat: 0.95 mg/dL (ref 0.60–1.35)
GFR, Est African American: 109 mL/min/{1.73_m2} (ref >=60)
GFR, Est Non African American: 94 mL/min/{1.73_m2} (ref >=60)
Globulin: 3 g/dL (calc) (ref 1.9–3.7)
Glucose, Bld: 116 mg/dL — ABNORMAL HIGH (ref 65–99)
Potassium: 3.7 mmol/L (ref 3.5–5.3)
Sodium: 138 mmol/L (ref 135–146)
Total Bilirubin: 0.2 mg/dL (ref 0.2–1.2)
Total Protein: 7 g/dL (ref 6.1–8.1)

## 2019-12-15 LAB — TEST AUTHORIZATION

## 2019-12-15 LAB — HIV-1 RNA QUANT-NO REFLEX-BLD
HIV 1 RNA Quant: 70200 Copies/mL — ABNORMAL HIGH
HIV-1 RNA Quant, Log: 4.85 Log cps/mL — ABNORMAL HIGH

## 2019-12-15 LAB — RPR: RPR Ser Ql: NONREACTIVE

## 2019-12-15 LAB — HIV-1 INTEGRASE GENOTYPE: Value last viral load: 70200 copies/mL

## 2019-12-15 LAB — LIPID PANEL
Cholesterol: 106 mg/dL (ref <200)
HDL: 27 mg/dL — ABNORMAL LOW (ref >=40)
LDL Cholesterol (Calc): 65 mg/dL (calc)
Non-HDL Cholesterol (Calc): 79 mg/dL (calc) (ref <130)
Total CHOL/HDL Ratio: 3.9 (calc) (ref <5.0)
Triglycerides: 63 mg/dL (ref <150)

## 2019-12-20 ENCOUNTER — Encounter: Payer: Self-pay | Admitting: Infectious Disease

## 2019-12-20 ENCOUNTER — Other Ambulatory Visit: Payer: Self-pay

## 2019-12-20 ENCOUNTER — Ambulatory Visit (INDEPENDENT_AMBULATORY_CARE_PROVIDER_SITE_OTHER): Payer: Medicare Other | Admitting: Infectious Disease

## 2019-12-20 VITALS — BP 169/109 | HR 94 | Temp 97.9°F | Wt 175.4 lb

## 2019-12-20 DIAGNOSIS — Z720 Tobacco use: Secondary | ICD-10-CM | POA: Diagnosis not present

## 2019-12-20 DIAGNOSIS — D696 Thrombocytopenia, unspecified: Secondary | ICD-10-CM

## 2019-12-20 DIAGNOSIS — F341 Dysthymic disorder: Secondary | ICD-10-CM

## 2019-12-20 DIAGNOSIS — Z113 Encounter for screening for infections with a predominantly sexual mode of transmission: Secondary | ICD-10-CM

## 2019-12-20 DIAGNOSIS — B20 Human immunodeficiency virus [HIV] disease: Secondary | ICD-10-CM | POA: Diagnosis not present

## 2019-12-20 LAB — T-HELPER CELL (CD4) - (RCID CLINIC ONLY)
CD4 % Helper T Cell: 10 % — ABNORMAL LOW (ref 33–65)
CD4 T Cell Abs: 159 /uL — ABNORMAL LOW (ref 400–1790)

## 2019-12-20 MED ORDER — SULFAMETHOXAZOLE-TRIMETHOPRIM 800-160 MG PO TABS: 1.0000 | ORAL_TABLET | Freq: Two times a day (BID) | ORAL | 6 refills | Status: DC

## 2019-12-20 MED ORDER — SYMTUZA 800-150-200-10 MG PO TABS: 1.0000 | ORAL_TABLET | Freq: Every day | ORAL | 11 refills | Status: DC

## 2019-12-20 NOTE — Progress Notes (Signed)
Subjective:  Chief complaint I want to restart my meds  Patient ID: Darrell Schwartz, male    DOB: 1970/01/31, 49 y.o.   MRN: [id number]  HPI   Darrell Schwartz is a 49 year old Caucasian man living with HIV that was previously perfectly controlled on darunavir based therapy.  Since I last saw him he underwent a divorce which precipitated severe depression.  He did endorse having had suicidal ideation but says that is resolved.  He stopped taking his antiretrovirals altogether has not taken them for more than a year.  He message me through epic asking if he could should start his antivirals again and I responded by saying we should but he does not appear to have received a prescription for this despite my being supportive of it.  His viral load is climbed to above 70,000 copies and his CD4 count is dropped below 200.  He says that he took out lawsuit against the man who had an affair with his wife while he and her were spending a great deal of part time apart in part due to the pandemic and there are different jobs that kept him apart out of state.  He says that he is now on good terms with his ex-wife but that again this break-up precipitated a severe depression.  He is ready to reinitiate antiretroviral therapy.  He has not been vaccinated against COVID-19 which I exhorted him to do.    Past Medical History:  Diagnosis Date  . Allergy   . Anxiety   . Decreased visual acuity 09/17/2014  . Encounter for long-term (current) use of medications 02/10/2016  . Hepatitis B immune 02/10/2016  . Hypertension   . Routine screening for STI (sexually transmitted infection) 02/10/2016  . Stroke ([place])   . Umbilical hernia 1/91/4782    No past surgical history on file.  Family History  Problem Relation Age of Onset  . Heart disease Mother   . Heart disease Father       Social History   Socioeconomic History  . Marital status: Married    Spouse name: Not on file  . Number of children: Not on file    . Years of education: Not on file  . Highest education level: Not on file  Occupational History  . Not on file  Tobacco Use  . Smoking status: Former Smoker    Packs/day: 0.25    Years: 17.00    Pack years: 4.25    Types: Cigarettes    Start date: 07/19/2013  . Smokeless tobacco: Never Used  . Tobacco comment: using electronic cigarettes  Substance and Sexual Activity  . Alcohol use: Yes    Alcohol/week: 2.0 standard drinks    Types: 2 drink(s) per week    Comment: rarely  . Drug use: No    Frequency: 1.0 times per week  . Sexual activity: Yes    Comment: pt. given condoms  Other Topics Concern  . Not on file  Social History Narrative  . Not on file   Social Determinants of Health   Financial Resource Strain:   . Difficulty of Paying Living Expenses: Not on file  Food Insecurity:   . Worried About Charity fundraiser in the Last Year: Not on file  . Ran Out of Food in the Last Year: Not on file  Transportation Needs:   . Lack of Transportation (Medical): Not on file  . Lack of Transportation (Non-Medical): Not on file  Physical Activity:   .  Days of Exercise per Week: Not on file  . Minutes of Exercise per Session: Not on file  Stress:   . Feeling of Stress : Not on file  Social Connections:   . Frequency of Communication with Friends and Family: Not on file  . Frequency of Social Gatherings with Friends and Family: Not on file  . Attends Religious Services: Not on file  . Active Member of Clubs or Organizations: Not on file  . Attends Archivist Meetings: Not on file  . Marital Status: Not on file    No Known Allergies   Current Outpatient Medications:  .  ALPRAZolam (XANAX) 1 MG tablet, Take 1 tablet (1 mg total) by mouth 4 (four) times daily as needed., Disp: 30 tablet, Rfl: 1 .  benzonatate (TESSALON) 100 MG capsule, Take 1-2 capsules (100-200 mg total) by mouth 3 (three) times daily. (Patient not taking: Reported on 03/16/2018), Disp: 30 capsule,  Rfl: 0 .  Darunavir-Cobicisctat-Emtricitabine-Tenofovir Alafenamide (SYMTUZA) 800-150-200-10 MG TABS, Take 1 tablet by mouth daily with breakfast., Disp: 30 tablet, Rfl: 11 .  ipratropium (ATROVENT) 0.06 % nasal spray, Place 2 sprays into both nostrils 4 (four) times daily. (Patient not taking: Reported on 03/16/2018), Disp: 15 mL, Rfl: 12 .  sertraline (ZOLOFT) 50 MG tablet, , Disp: , Rfl:  .  sulfamethoxazole-trimethoprim (BACTRIM DS) 800-160 MG tablet, Take 1 tablet by mouth 2 (two) times daily., Disp: 30 tablet, Rfl: 6 .  varenicline (CHANTIX CONTINUING MONTH PAK) 1 MG tablet, Take 1 tablet (1 mg total) by mouth 2 (two) times daily., Disp: 60 tablet, Rfl: 1 .  varenicline (CHANTIX) 0.5 MG tablet, Take 1 tablet (0.5 mg total) by mouth 2 (two) times daily. (Patient not taking: Reported on 12/20/2019), Disp: 60 tablet, Rfl: 0   Review of Systems  Constitutional: Negative for activity change, appetite change, chills, diaphoresis, fatigue, fever and unexpected weight change.  HENT: Negative for congestion, rhinorrhea, sinus pressure, sneezing, sore throat and trouble swallowing.   Eyes: Negative for photophobia and visual disturbance.  Respiratory: Negative for cough, chest tightness, shortness of breath, wheezing and stridor.   Cardiovascular: Negative for chest pain, palpitations and leg swelling.  Gastrointestinal: Negative for abdominal distention, abdominal pain, anal bleeding, blood in stool, constipation, diarrhea, nausea and vomiting.  Genitourinary: Negative for difficulty urinating, dysuria, flank pain and hematuria.  Musculoskeletal: Negative for arthralgias, back pain, gait problem, joint swelling and myalgias.  Skin: Negative for color change, pallor, rash and wound.  Neurological: Negative for dizziness, tremors, weakness and light-headedness.  Hematological: Negative for adenopathy. Does not bruise/bleed easily.  Psychiatric/Behavioral: Positive for dysphoric mood. Negative for  agitation, behavioral problems, confusion, decreased concentration, self-injury and sleep disturbance.       Objective:   Physical Exam Constitutional:      General: He is not in acute distress.    Appearance: Normal appearance. He is well-developed. He is not ill-appearing or diaphoretic.  HENT:     Head: Normocephalic and atraumatic.     Right Ear: Hearing and external ear normal.     Left Ear: Hearing and external ear normal.     Nose: No nasal deformity or rhinorrhea.  Eyes:     General: No scleral icterus.    Conjunctiva/sclera: Conjunctivae normal.     Right eye: Right conjunctiva is not injected.     Left eye: Left conjunctiva is not injected.  Neck:     Vascular: No JVD.  Cardiovascular:     Rate and Rhythm: Normal  rate and regular rhythm.     Heart sounds: Normal heart sounds, S1 normal and S2 normal. No murmur heard.  No friction rub.  Pulmonary:     Effort: Pulmonary effort is normal. No respiratory distress.  Abdominal:     General: There is no distension.     Tenderness: There is no abdominal tenderness.  Musculoskeletal:        General: Normal range of motion.     Right shoulder: Normal.     Left shoulder: Normal.     Cervical back: Normal range of motion and neck supple.     Right hip: Normal.     Left hip: Normal.     Right knee: Normal.     Left knee: Normal.  Lymphadenopathy:     Head:     Right side of head: No submandibular, preauricular or posterior auricular adenopathy.     Left side of head: No submandibular, preauricular or posterior auricular adenopathy.     Cervical: No cervical adenopathy.     Right cervical: No superficial or deep cervical adenopathy.    Left cervical: No superficial or deep cervical adenopathy.  Skin:    General: Skin is warm and dry.     Coloration: Skin is not pale.     Findings: No abrasion, bruising, ecchymosis, erythema, lesion or rash.     Nails: There is no clubbing.  Neurological:     General: No focal deficit  present.     Mental Status: He is alert and oriented to person, place, and time.     Sensory: No sensory deficit.     Coordination: Coordination normal.     Gait: Gait normal.  Psychiatric:        Attention and Perception: He is attentive.        Mood and Affect: Mood is depressed.        Speech: Speech normal.        Behavior: Behavior normal. Behavior is cooperative.        Thought Content: Thought content normal.        Cognition and Memory: Cognition and memory normal.        Judgment: Judgment normal.           Assessment & Plan:  HIV and AIDS: Restart Symtuza and I gave him a bottle of 30 tablets today and sent prescriptions into his mail order pharmacy  Have him come back for labs in 1 month's time and repeat them and see him couple weeks afterwards  Start Bactrim for PCP prevention  Thrombocytopenia: Likely related to [name] being poorly controlled we will repeat labs today and consider whether we need any other interventions.  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  Hypertension: Some is may be in part due to anxiety and depression and stress of coming to the clinic.

## 2020-01-02 LAB — COMPLETE METABOLIC PANEL WITH GFR
AG Ratio: 1.2 (calc) (ref 1.0–2.5)
ALT: 46 U/L (ref 9–46)
AST: 48 U/L — ABNORMAL HIGH (ref 10–40)
Albumin: 4.1 g/dL (ref 3.6–5.1)
Alkaline phosphatase (APISO): 132 U/L — ABNORMAL HIGH (ref 36–130)
BUN: 11 mg/dL (ref 7–25)
CO2: 27 mmol/L (ref 20–32)
Calcium: 8.9 mg/dL (ref 8.6–10.3)
Chloride: 109 mmol/L (ref 98–110)
Creat: 0.96 mg/dL (ref 0.60–1.35)
GFR, Est African American: 108 mL/min/{1.73_m2} (ref >=60)
GFR, Est Non African American: 93 mL/min/{1.73_m2} (ref >=60)
Globulin: 3.4 g/dL (calc) (ref 1.9–3.7)
Glucose, Bld: 91 mg/dL (ref 65–99)
Potassium: 3.9 mmol/L (ref 3.5–5.3)
Sodium: 141 mmol/L (ref 135–146)
Total Bilirubin: 0.3 mg/dL (ref 0.2–1.2)
Total Protein: 7.5 g/dL (ref 6.1–8.1)

## 2020-01-02 LAB — CBC WITH DIFFERENTIAL/PLATELET
Absolute Monocytes: 345 {cells}/uL (ref 200–950)
Basophils Absolute: 18 {cells}/uL (ref 0–200)
Basophils Relative: 0.4 %
Eosinophils Absolute: 161 {cells}/uL (ref 15–500)
Eosinophils Relative: 3.5 %
HCT: 39.1 % (ref 38.5–50.0)
Hemoglobin: 13.1 g/dL — ABNORMAL LOW (ref 13.2–17.1)
Lymphs Abs: 1720 {cells}/uL (ref 850–3900)
MCH: 30.8 pg (ref 27.0–33.0)
MCHC: 33.5 g/dL (ref 32.0–36.0)
MCV: 91.8 fL (ref 80.0–100.0)
MPV: 10.3 fL (ref 7.5–12.5)
Monocytes Relative: 7.5 %
Neutro Abs: 2355 {cells}/uL (ref 1500–7800)
Neutrophils Relative %: 51.2 %
Platelets: 115 10*3/uL — ABNORMAL LOW (ref 140–400)
RBC: 4.26 Million/uL (ref 4.20–5.80)
RDW: 12.3 % (ref 11.0–15.0)
Total Lymphocyte: 37.4 %
WBC: 4.6 10*3/uL (ref 3.8–10.8)

## 2020-01-02 LAB — HIV-1 RNA ULTRAQUANT REFLEX TO GENTYP+
HIV 1 RNA Quant: 430000 copies/mL — ABNORMAL HIGH
HIV-1 RNA Quant, Log: 5.63 Log copies/mL — ABNORMAL HIGH

## 2020-01-02 LAB — HEPATITIS B SURFACE ANTIGEN: Hepatitis B Surface Ag: NONREACTIVE

## 2020-01-02 LAB — HEPATITIS B SURFACE ANTIBODY, QUANTITATIVE: Hep B S AB Quant (Post): <5 m[IU]/mL — ABNORMAL LOW (ref >=10)

## 2020-01-02 LAB — HEPATITIS C ANTIBODY
Hepatitis C Ab: NONREACTIVE
SIGNAL TO CUT-OFF: 0.06

## 2020-01-02 LAB — HIV-1 GENOTYPE: HIV-1 Genotype: DETECTED — AB

## 2020-01-22 ENCOUNTER — Telehealth: Payer: Self-pay

## 2020-01-22 NOTE — Telephone Encounter (Signed)
Received fax from [facility] Dept. For medical records. Request is for last two office visits and and medication list. Will fax records. Lorenso Courier, [place]

## 2020-01-23 ENCOUNTER — Other Ambulatory Visit: Payer: Medicare Other

## 2020-02-07 ENCOUNTER — Ambulatory Visit: Payer: Medicare Other

## 2020-02-07 ENCOUNTER — Encounter: Payer: Medicare Other | Admitting: Infectious Disease

## 2020-03-13 ENCOUNTER — Other Ambulatory Visit: Payer: Medicare HMO

## 2020-04-12 ENCOUNTER — Encounter: Payer: Medicare HMO | Admitting: Infectious Disease

## 2020-06-10 ENCOUNTER — Telehealth: Payer: Self-pay

## 2020-06-10 NOTE — Telephone Encounter (Signed)
Patient called from [facility] stating he has been committed to behavior health and they will not give him Symtuza. He said they offered a combo med but he does not trust them. Had him put Angie on phone who is a nurse there and advised her we would go with their recommendations and if there is concerns they can page on call provider. Angie stated they do not stock Symtuza but they have a combination of drugs that are similar and he refuses to take them. Advised she can contact provider on call for any concerns moving forward.

## 2020-07-08 ENCOUNTER — Other Ambulatory Visit: Payer: Self-pay | Admitting: Family

## 2020-07-08 DIAGNOSIS — B20 Human immunodeficiency virus [HIV] disease: Secondary | ICD-10-CM

## 2020-09-27 ENCOUNTER — Telehealth: Payer: Self-pay

## 2020-09-27 DIAGNOSIS — B20 Human immunodeficiency virus [HIV] disease: Secondary | ICD-10-CM

## 2020-09-27 MED ORDER — SULFAMETHOXAZOLE-TRIMETHOPRIM 800-160 MG PO TABS: 1.0000 | ORAL_TABLET | Freq: Two times a day (BID) | ORAL | 2 refills | Status: AC

## 2020-09-27 NOTE — Telephone Encounter (Signed)
Received faxed refill request for Bactrim. Patient overdue for follow up appointment. Called patient to schedule, no answer.   Okay to refill per Dr. Tommy Medal.   Beryle Flock, RN

## 2020-11-18 ENCOUNTER — Telehealth: Payer: Self-pay

## 2020-11-18 NOTE — Telephone Encounter (Signed)
Called patient, line kept ringing and unable to leave voicemail. Patient overdue for appointment.   Beryle Flock, RN

## 2020-12-25 ENCOUNTER — Telehealth: Payer: Self-pay

## 2020-12-25 NOTE — Telephone Encounter (Signed)
Received refill request for Symtuza, called patient to schedule overdue appointment. Call could not be completed.   Beryle Flock, RN

## 2021-01-16 ENCOUNTER — Other Ambulatory Visit: Payer: Self-pay

## 2021-01-16 DIAGNOSIS — B20 Human immunodeficiency virus [HIV] disease: Secondary | ICD-10-CM

## 2021-01-16 MED ORDER — SYMTUZA 800-150-200-10 MG PO TABS: 1.0000 | ORAL_TABLET | Freq: Every day | ORAL | 2 refills | Status: AC

## 2021-01-16 NOTE — Progress Notes (Signed)
Patient states he lives in [place], [place] area. He may be in search of transferring care closer to his area in the near future. Darrell Schwartz

## 2021-03-24 NOTE — Progress Notes (Unsigned)
Subjective:  Chief complaint   Patient ID: Darrell Schwartz, male    DOB: [birth year], 51 y.o.   MRN: [id number]  HPI   Darrell Schwartz is a 51 year old Caucasian man living with HIV that was previously perfectly controlled on darunavir based therapy.  Since I last saw him he underwent a divorce which precipitated severe depression.  He did endorse having had suicidal ideation but says that is resolved.  He stopped taking his antiretrovirals altogether has not taken them for more than a year.  He message me through epic asking if he could should start his antivirals again and I responded by saying we should but he does not appear to have received a prescription for this despite my being supportive of it.  His viral load is climbed to above 70,000 copies and his CD4 count is dropped below 200.  He says that he took out lawsuit against the man who had an affair with his wife while he and her were spending a great deal of part time apart in part due to the pandemic and there are different jobs that kept him apart out of state.  He says that he is now on good terms with his ex-wife but that again this break-up precipitated a severe depression.  He was ready to reinitiate antiretroviral therapy at his last visit     Past Medical History:  Diagnosis Date   Allergy    Anxiety    Decreased visual acuity 09/17/2014   Encounter for long-term (current) use of medications 02/10/2016   Hepatitis B immune 02/10/2016   Hypertension    Routine screening for STI (sexually transmitted infection) 02/10/2016   Stroke [facility])    Umbilical hernia 09/04/1749    No past surgical history on file.  Family History  Problem Relation Age of Onset   Heart disease Mother    Heart disease Father       Social History   Socioeconomic History   Marital status: Married    Spouse name: Not on file   Number of children: Not on file   Years of education: Not on file   Highest education level: Not on file  Occupational  History   Not on file  Tobacco Use   Smoking status: Former    Packs/day: 0.25    Years: 17.00    Pack years: 4.25    Types: Cigarettes    Start date: 07/19/2013   Smokeless tobacco: Never   Tobacco comments:    using electronic cigarettes  Substance and Sexual Activity   Alcohol use: Yes    Alcohol/week: 2.0 standard drinks    Types: 2 drink(s) per week    Comment: rarely   Drug use: No    Frequency: 1.0 times per week   Sexual activity: Yes    Comment: pt. given condoms  Other Topics Concern   Not on file  Social History Narrative   Not on file   Social Determinants of Health   Financial Resource Strain: Not on file  Food Insecurity: Not on file  Transportation Needs: Not on file  Physical Activity: Not on file  Stress: Not on file  Social Connections: Not on file    No Known Allergies   Current Outpatient Medications:    ALPRAZolam (XANAX) 1 MG tablet, Take 1 tablet (1 mg total) by mouth 4 (four) times daily as needed., Disp: 30 tablet, Rfl: 1   benzonatate (TESSALON) 100 MG capsule, Take 1-2 capsules (100-200 mg total) by mouth  3 (three) times daily. (Patient not taking: Reported on 03/16/2018), Disp: 30 capsule, Rfl: 0   Darunavir-Cobicistat-Emtricitabine-Tenofovir Alafenamide (SYMTUZA) 800-150-200-10 MG TABS, Take 1 tablet by mouth daily with breakfast., Disp: 30 tablet, Rfl: 2   ipratropium (ATROVENT) 0.06 % nasal spray, Place 2 sprays into both nostrils 4 (four) times daily. (Patient not taking: Reported on 03/16/2018), Disp: 15 mL, Rfl: 12   sertraline (ZOLOFT) 50 MG tablet, , Disp: , Rfl:    sulfamethoxazole-trimethoprim (BACTRIM DS) 800-160 MG tablet, Take 1 tablet by mouth 2 (two) times daily., Disp: 30 tablet, Rfl: 2   varenicline (CHANTIX CONTINUING MONTH PAK) 1 MG tablet, Take 1 tablet (1 mg total) by mouth 2 (two) times daily., Disp: 60 tablet, Rfl: 1   varenicline (CHANTIX) 0.5 MG tablet, Take 1 tablet (0.5 mg total) by mouth 2 (two) times daily. (Patient  not taking: Reported on 12/20/2019), Disp: 60 tablet, Rfl: 0   Review of Systems     Objective:   Physical Exam        Assessment & Plan:  HIV/AIDS:    Thrombocytopenia: Likely related to [name] being poorly controlled we will repeat labs today and consider whether we need any other interventions.  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  Hypertension: Some is may be in part due to anxiety and depression and stress of coming to the clinic.

## 2021-03-25 ENCOUNTER — Ambulatory Visit: Payer: Medicare HMO | Admitting: Infectious Disease

## 2021-03-25 ENCOUNTER — Telehealth: Payer: Self-pay

## 2021-03-25 DIAGNOSIS — B20 Human immunodeficiency virus [HIV] disease: Secondary | ICD-10-CM

## 2021-03-25 DIAGNOSIS — Z716 Tobacco abuse counseling: Secondary | ICD-10-CM

## 2021-03-25 NOTE — Telephone Encounter (Signed)
Called patient regarding missed appointment today. Left voicemail requesting he call back to reschedule. Will need to confirm with patient if he will continue following up with [facility] or if he plans on transferring care to local provider. Leatrice Jewels, RMA

## 2021-05-12 ENCOUNTER — Telehealth: Payer: Self-pay

## 2021-05-12 NOTE — Telephone Encounter (Signed)
Called patient to offer appointment, no answer and unable to leave message.  ? ?Referral sent to state bridge counselor 01/2021. ? ?Beryle Flock, RN ? ?

## 2022-01-09 ENCOUNTER — Other Ambulatory Visit: Payer: Self-pay

## 2022-01-09 ENCOUNTER — Ambulatory Visit
Admission: EM | Admit: 2022-01-09 | Discharge: 2022-01-09 | Disposition: A | Discharge status: Home / Self Care | Payer: Medicare Other | Attending: Nurse Practitioner | Admitting: Nurse Practitioner

## 2022-01-09 DIAGNOSIS — Z1152 Encounter for screening for COVID-19: Secondary | ICD-10-CM | POA: Diagnosis not present

## 2022-01-09 DIAGNOSIS — J111 Influenza due to unidentified influenza virus with other respiratory manifestations: Secondary | ICD-10-CM

## 2022-01-09 DIAGNOSIS — Z79899 Other long term (current) drug therapy: Secondary | ICD-10-CM | POA: Insufficient documentation

## 2022-01-09 DIAGNOSIS — R0981 Nasal congestion: Secondary | ICD-10-CM

## 2022-01-09 DIAGNOSIS — J101 Influenza due to other identified influenza virus with other respiratory manifestations: Secondary | ICD-10-CM | POA: Insufficient documentation

## 2022-01-09 LAB — RESP PANEL BY RT-PCR (RSV, FLU A&B, COVID)  RVPGX2
Influenza A by PCR: POSITIVE — AB
Influenza B by PCR: NEGATIVE
Resp Syncytial Virus by PCR: NEGATIVE
SARS Coronavirus 2 by RT PCR: NEGATIVE

## 2022-01-09 MED ORDER — OSELTAMIVIR PHOSPHATE 75 MG PO CAPS: 75.0000 mg | ORAL_CAPSULE | Freq: Two times a day (BID) | ORAL | 0 refills | Status: AC

## 2022-01-09 MED ORDER — BENZONATATE 100 MG PO CAPS: 100.0000 mg | ORAL_CAPSULE | Freq: Three times a day (TID) | ORAL | 0 refills | Status: AC | PRN

## 2022-01-09 MED ORDER — VARENICLINE TARTRATE 1 MG PO TABS: 1.0000 mg | ORAL_TABLET | Freq: Two times a day (BID) | ORAL | 0 refills | Status: AC

## 2022-01-09 NOTE — ED Provider Notes (Addendum)
[facility]    CSN: [id number] Arrival date & time: 01/09/22  1001      History   Chief Complaint Chief Complaint  Patient presents with   Cough   Nasal Congestion    HPI Darrell Schwartz is a 51 y.o. male.   HPI  He is in today complaining of nasal congestion, cough and sore throat for 2 days. He is SP multiple tooth extractions with found infection. Has been started on Amoxicillin 500 mg TID. He does not feel like this is effective because he does not have the anbx taste in his mouth.  Denies Fever, chills,  sneezing, runny nose, new loss of smell or taste, shortness of breath, chest pain, nausea, or diarrhea. He has been trying to stay hydrated. He has a decreased appetite but denies gum pain.  Exposure negative COVID/Influenza/Strep.  Past Medical History:  Diagnosis Date   Allergy    Anxiety    Decreased visual acuity 09/17/2014   Encounter for long-term (current) use of medications 02/10/2016   Hepatitis B immune 02/10/2016   Hypertension    Routine screening for STI (sexually transmitted infection) 02/10/2016   Stroke ([place])    Umbilical hernia 1/69/6789    Patient Active Problem List   Diagnosis Date Noted   Umbilical hernia 38/10/1749   Routine screening for STI (sexually transmitted infection) 02/10/2016   Encounter for smoking cessation counseling 02/10/2016   Decreased visual acuity 09/17/2014   Insomnia 04/22/2011   Tobacco abuse 06/09/2010   Injury of left shoulder 06/09/2010   CONSTIPATION 02/18/2010   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 No pertinent surgical history.     Home Medications    Prior to Admission medications    Medication Sig Start Date End Date Taking? Authorizing Provider  amoxicillin (AMOXIL) 500 MG tablet Take 500 mg by mouth 3 (three) times daily. 01/05/22  Yes [provider]  azithromycin (ZITHROMAX) 250 MG tablet Take by mouth. 10/27/21  Yes [provider]  benzonatate (TESSALON) 100 MG capsule Take 1 capsule (100 mg total) by mouth 3 (three) times daily as needed for up to 10 days for cough. Never suck or chew on a benzonatate capsule. 01/09/22 01/19/22 Yes Vevelyn Francois, NP  clotrimazole-betamethasone (LOTRISONE) cream SMARTSIG:Sparingly Topical Twice Daily 09/22/21  Yes [provider]  ibuprofen (ADVIL) 800 MG tablet Take 800 mg by mouth 3 (three) times daily. 01/05/22  Yes [provider]  oseltamivir (TAMIFLU) 75 MG capsule Take 1 capsule (75 mg total) by mouth every 12 (twelve) hours. 01/09/22  Yes Vevelyn Francois, NP  traMADol (ULTRAM) 50 MG tablet Take 50 mg by mouth every 6 (six) hours as needed. 01/05/22  Yes [provider]  varenicline (CHANTIX CONTINUING MONTH PAK) 1 MG tablet Take 1 tablet (1 mg total) by mouth 2 (two) times daily. 01/09/22 02/08/22 Yes Nevin Kozuch, Diona Foley, NP  ALPRAZolam Duanne Moron) 1 MG tablet Take 1 tablet (1 mg total) by mouth 4 (four) times daily as needed. 08/23/12   Thurnell Lose, MD  Darunavir-Cobicistat-Emtricitabine-Tenofovir Alafenamide [facility]) 800-150-200-10 MG TABS Take 1 tablet by mouth daily with breakfast. 01/16/21   Tommy Medal, Lavell Islam, MD  Darunavir-Cobicistat-Emtricitabine-Tenofovir Alafenamide [facility])  800-150-200-10 MG TABS Take 1 tablet by mouth daily.    [provider]  olmesartan (BENICAR) 20 MG tablet Take 20 mg by mouth daily.    [provider]  sulfamethoxazole-trimethoprim (BACTRIM DS) 800-160 MG tablet Take 1 tablet by mouth 2 (two) times daily. 09/27/20   Truman Hayward, MD    Family History Family History  Problem Relation Age of Onset   Heart disease Mother     Heart disease Father     Social History Social History   Tobacco Use   Smoking status: Former    Packs/day: 0.25    Years: 17.00    Total pack years: 4.25    Types: Cigarettes    Start date: 07/19/2013   Smokeless tobacco: Never   Tobacco comments:    using electronic cigarettes  Substance Use Topics   Alcohol use: Yes    Alcohol/week: 2.0 standard drinks of alcohol    Types: 2 drink(s) per week    Comment: rarely   Drug use: No    Frequency: 1.0 times per week     Allergies   Patient has no known allergies.   Review of Systems Review of Systems   Physical Exam Triage Vital Signs ED Triage Vitals  Enc Vitals Group     BP 01/09/22 1102 (!) 143/92     Pulse Rate 01/09/22 1102 83     Resp 01/09/22 1102 18     Temp 01/09/22 1102 99.5 F (37.5 C)     Temp Source 01/09/22 1102 Oral     SpO2 01/09/22 1102 97 %     Weight --      Height --      Head Circumference --      Peak Flow --      Pain Score 01/09/22 1054 0     Pain Loc --      Pain Edu? --      Excl. in [place]? --    No data found.  Updated Vital Signs BP (!) 143/92 (BP Location: Right Arm)   Pulse 83   Temp 99.5 F (37.5 C) (Oral)   Resp 18   SpO2 97%   Visual Acuity Right Eye Distance:   Left Eye Distance:   Bilateral Distance:    Right Eye Near:   Left Eye Near:    Bilateral Near:     Physical Exam Constitutional:      General: He is not in acute distress. HENT:     Head: Normocephalic.     Right Ear: Tympanic membrane normal.     Left Ear: Tympanic membrane normal.     Nose: Nose normal.     Mouth/Throat:     Mouth: Mucous membranes are moist.     Pharynx: Posterior oropharyngeal erythema present. No oropharyngeal exudate.  Eyes:     Pupils: Pupils are equal, round, and reactive to light.  Cardiovascular:     Rate and Rhythm: Normal rate and regular rhythm.     Pulses: Normal pulses.     Heart sounds: Normal heart sounds.  Pulmonary:     Effort: Pulmonary effort is normal.   Musculoskeletal:        General: Normal range of motion.     Cervical back: Normal range of motion.  Skin:    General: Skin is warm and dry.     Capillary Refill: Capillary refill takes less than 2 seconds.  Neurological:     General: No focal deficit present.  Mental Status: He is alert.  Psychiatric:        Mood and Affect: Mood normal.        Behavior: Behavior normal.      UC Treatments / Results  Labs (all labs ordered are listed, but only abnormal results are displayed) Labs Reviewed  RESP PANEL BY RT-PCR (RSV, FLU A&B, COVID)  RVPGX2 - Abnormal; Notable for the following components:      Result Value   Influenza A by PCR POSITIVE (*)    All other components within normal limits    EKG   Radiology No results found.  Procedures Procedures (including critical care time)  Medications Ordered in UC Medications - No data to display  Initial Impression / Assessment and Plan / UC Course  I have reviewed the triage vital signs and the nursing notes.  Pertinent labs & imaging results that were available during my care of the patient were reviewed by me and considered in my medical decision making (see chart for details).     Nasal congestion Final Clinical Impressions(s) / UC Diagnoses   Final diagnoses:  Nasal congestion  Influenza with respiratory manifestation     Discharge Instructions      You have tested positive for influenza.. You have been prescirbed Tamiflu. Please take as directed.  Your COVID, RSV and Strep Test are all negative. We encourage conservative treatment with symptom relief. We encourage you to use Tylenol alternating with Ibuprofen for your fever if not contraindicated. (Remember to use as directed do not exceed daily dosing recommendations) We also encourage salt water gargles for your sore throat. You should also consider throat lozenges and chloraseptic spray.  Your cough can be soothed with a cough suppressant.      Pt  requested cough medication and chantix to nurse post discharge. Orders as follows.   ED Prescriptions     Medication Sig Dispense Auth. Provider   oseltamivir (TAMIFLU) 75 MG capsule Take 1 capsule (75 mg total) by mouth every 12 (twelve) hours. 10 capsule Vevelyn Francois, NP   benzonatate (TESSALON) 100 MG capsule Take 1 capsule (100 mg total) by mouth 3 (three) times daily as needed for up to 10 days for cough. Never suck or chew on a benzonatate capsule. 30 capsule Vevelyn Francois, NP   varenicline (CHANTIX CONTINUING MONTH PAK) 1 MG tablet Take 1 tablet (1 mg total) by mouth 2 (two) times daily. 60 tablet Vevelyn Francois, NP      PDMP not reviewed this encounter.   Vevelyn Francois, NP 01/09/22 1200    Vevelyn Francois, NP 01/09/22 1215

## 2022-01-09 NOTE — ED Triage Notes (Addendum)
Pt is here with a cough and nasal congestion for 2 days now, pt has taken OTC/prescribed meds to relieve discomfort. Pt states he had dental surgery due to an infection on Tuesday.

## 2022-01-09 NOTE — Discharge Instructions (Addendum)
You have tested positive for influenza.. You have been prescirbed Tamiflu. Please take as directed.  Your COVID, RSV and Strep Test are all negative. We encourage conservative treatment with symptom relief. We encourage you to use Tylenol alternating with Ibuprofen for your fever if not contraindicated. (Remember to use as directed do not exceed daily dosing recommendations) We also encourage salt water gargles for your sore throat. You should also consider throat lozenges and chloraseptic spray.  Your cough can be soothed with a cough suppressant.

## 2022-04-13 ENCOUNTER — Telehealth: Payer: Self-pay

## 2022-04-13 NOTE — Transitions of Care (Post Inpatient/ED Visit) (Signed)
   04/13/2022  Name: Darrell Schwartz MRN: [id number] DOB: 1970/05/15  Today's TOC FU Call Status: Today's TOC FU Call Status:: Unsuccessul Call (1st Attempt) Unsuccessful Call (1st Attempt) Date: 04/13/22  Attempted to reach the patient regarding the most recent Inpatient/ED visit.  Follow Up Plan: Additional outreach attempts will be made to reach the patient to complete the Transitions of Care (Post Inpatient/ED visit) call.   [place] RMA

## 2022-04-14 ENCOUNTER — Telehealth: Payer: Self-pay

## 2022-04-14 NOTE — Transitions of Care (Post Inpatient/ED Visit) (Cosign Needed)
   04/14/2022  Name: Darrell Schwartz MRN: [id number] DOB: 02-16-1970  Today's TOC FU Call Status: Today's TOC FU Call Status:: Successful TOC FU Call Competed TOC FU Call Complete Date: 04/14/22  Per pt he does not need to complete this call. Pt advised that he has a pcp and does not want to change. Pt was advised to call his insurance company to have  pcp added  to his coverage.    [name] RMA
